# Patient Record
Sex: Female | Born: 1990
Health system: Southern US, Community
[De-identification: ages and names within clinical notes are randomized; demographics above are authoritative.]

## PROBLEM LIST (undated history)

## (undated) DIAGNOSIS — D649 Anemia, unspecified: Secondary | ICD-10-CM

## (undated) DIAGNOSIS — N3010 Interstitial cystitis (chronic) without hematuria: Secondary | ICD-10-CM

## (undated) DIAGNOSIS — K219 Gastro-esophageal reflux disease without esophagitis: Secondary | ICD-10-CM

## (undated) DIAGNOSIS — R3129 Other microscopic hematuria: Secondary | ICD-10-CM

## (undated) DIAGNOSIS — N390 Urinary tract infection, site not specified: Secondary | ICD-10-CM

## (undated) HISTORY — DX: Interstitial cystitis (chronic) without hematuria: N30.10

## (undated) HISTORY — DX: Other microscopic hematuria: R31.29

## (undated) HISTORY — PX: WISDOM TOOTH EXTRACTION: SHX21

---
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
## 2015-10-01 ENCOUNTER — Ambulatory Visit (INDEPENDENT_AMBULATORY_CARE_PROVIDER_SITE_OTHER): Payer: BLUE CROSS/BLUE SHIELD | Admitting: Obstetrics and Gynecology

## 2015-10-01 ENCOUNTER — Encounter: Payer: Self-pay | Admitting: Obstetrics and Gynecology

## 2015-10-01 ENCOUNTER — Other Ambulatory Visit: Payer: Self-pay | Admitting: Obstetrics and Gynecology

## 2015-10-01 VITALS — BP 106/66 | HR 79 | Ht 68.0 in | Wt 133.2 lb

## 2015-10-01 DIAGNOSIS — R300 Dysuria: Secondary | ICD-10-CM

## 2015-10-01 DIAGNOSIS — Z01419 Encounter for gynecological examination (general) (routine) without abnormal findings: Secondary | ICD-10-CM

## 2015-10-01 NOTE — Progress Notes (Signed)
GYNECOLOGY ANNUAL PHYSICAL EXAM PROGRESS NOTE  Subjective:    Monica Price is a 25 y.o. G0P0 female who presents for an annual exam. The patient has the following complaints today: mild intermittent dysuria over 3-4 months. The patient is sexually active. The patient wears seatbelts: yes. The patient participates in regular exercise: no. Has the patient ever been transfused or tattooed?: no. The patient reports that there is not domestic violence in her life.    Gynecologic History Menarche age: 31 Patient's last menstrual period was 09/20/2015. Period Cycle (Days): 28 Period Duration (Days): 5-7 Period Pattern: Regular Menstrual Flow: Moderate Dysmenorrhea: None  Contraception: condoms (inconsistent use), but has been abstinent since July.  History of STI's: Denies  Last Pap: 08/2014. Results were: normal.  Denies h/o abnormal pap smears.    Obstetric History   G0   P0   T0   P0   A0   L0    SAB0   TAB0   Ectopic0   Multiple0   Live Births0       History reviewed. No pertinent past medical history.  History reviewed. No pertinent surgical history.  History reviewed. No pertinent family history.  Social History   Social History  . Marital status: Single    Spouse name: N/A  . Number of children: N/A  . Years of education: N/A   Occupational History  . Not on file.   Social History Main Topics  . Smoking status: Never Smoker  . Smokeless tobacco: Never Used  . Alcohol use No  . Drug use: No  . Sexual activity: No   Other Topics Concern  . Not on file   Social History Narrative  . No narrative on file    No current outpatient prescriptions on file prior to visit.   No current facility-administered medications on file prior to visit.     No Known Allergies   Review of Systems Constitutional: negative for chills, fatigue, fevers and sweats Eyes: negative for irritation, redness and visual disturbance Ears, nose, mouth, throat, and face: negative  for hearing loss, nasal congestion, snoring and tinnitus Respiratory: negative for asthma, cough, sputum Cardiovascular: negative for chest pain, dyspnea, exertional chest pressure/discomfort, irregular heart beat, palpitations and syncope Gastrointestinal: negative for abdominal pain, change in bowel habits, nausea and vomiting Genitourinary: positive for dysuria.  Negative for abnormal menstrual periods, genital lesions, sexual problems and vaginal discharge, and urinary incontinence Integument/breast: negative for breast lump, breast tenderness and nipple discharge Hematologic/lymphatic: negative for bleeding and easy bruising Musculoskeletal:negative for back pain and muscle weakness Neurological: negative for dizziness, headaches, vertigo and weakness Endocrine: negative for diabetic symptoms including polydipsia, polyuria and skin dryness Allergic/Immunologic: negative for hay fever and urticaria     Objective:  Blood pressure 106/66, pulse 79, height 5\' 8"  (1.727 m), weight 133 lb 3.2 oz (60.4 kg), last menstrual period 09/20/2015. Body mass index is 20.25 kg/m.  General Appearance:    Alert, cooperative, no distress, appears stated age  Head:    Normocephalic, without obvious abnormality, atraumatic  Eyes:    PERRL, conjunctiva/corneas clear, EOM's intact, both eyes  Ears:    Normal external ear canals, both ears  Nose:   Nares normal, septum midline, mucosa normal, no drainage or sinus tenderness  Throat:   Lips, mucosa, and tongue normal; teeth and gums normal  Neck:   Supple, symmetrical, trachea midline, no adenopathy; thyroid: no enlargement/tenderness/nodules; no carotid bruit or JVD  Back:     Symmetric,  no curvature, ROM normal, no CVA tenderness  Lungs:     Clear to auscultation bilaterally, respirations unlabored  Chest Wall:    No tenderness or deformity   Heart:    Regular rate and rhythm, S1 and S2 normal, no murmur, rub or gallop  Breast Exam:    No tenderness,  masses, or nipple abnormality  Abdomen:     Soft, non-tender, bowel sounds active all four quadrants, no masses, no organomegaly.    Genitalia:    Pelvic:external genitalia normal, vagina without lesions, discharge, or tenderness, rectovaginal septum  normal. Cervix normal in appearance, no cervical motion tenderness, no adnexal masses or tenderness.  Uterus normal size, shape, mobile, regular contours, nontender.  Rectal:    Normal external sphincter.  No hemorrhoids appreciated. Internal exam not done.   Extremities:   Extremities normal, atraumatic, no cyanosis or edema  Pulses:   2+ and symmetric all extremities  Skin:   Skin color, texture, turgor normal, no rashes or lesions  Lymph nodes:   Cervical, supraclavicular, and axillary nodes normal  Neurologic:   CNII-XII intact, normal strength, sensation and reflexes throughout   .  Labs:  No results found for: WBC, HGB, HCT, MCV, PLT  No results found for: CREATININE, BUN, NA, K, CL, CO2  No results found for: ALT, AST, GGT, ALKPHOS, BILITOT  No results found for: TSH   Assessment:    Healthy female exam.   Dysuria  Plan:     Blood tests: Basic metabolic panel and CBC with diff. Breast self exam technique reviewed and patient encouraged to perform self-exam monthly. Contraception: abstinence.  Also discussed safe sex practices. Discussed healthy lifestyle modifications. Dysuria - mild, intermittent.  Has tried Azo, cranberry juice, probiotics which offered minimal relief.  UA ordered.  GC/Cl performed. Declined STD serology panel.  Discussed Gardasil vaccine, handout given.  Follow up in 1 year.    Rubie Maid, MD [facility]

## 2015-10-01 NOTE — Patient Instructions (Signed)
Health Maintenance, Female Adopting a healthy lifestyle and getting preventive care can go a long way to promote health and wellness. Talk with your health care provider about what schedule of regular examinations is right for you. This is a good chance for you to check in with your provider about disease prevention and staying healthy. In between checkups, there are plenty of things you can do on your own. Experts have done a lot of research about which lifestyle changes and preventive measures are most likely to keep you healthy. Ask your health care provider for more information. WEIGHT AND DIET  Eat a healthy diet  Be sure to include plenty of vegetables, fruits, low-fat dairy products, and lean protein.  Do not eat a lot of foods high in solid fats, added sugars, or salt.  Get regular exercise. This is one of the most important things you can do for your health.  Most adults should exercise for at least 150 minutes each week. The exercise should increase your heart rate and make you sweat (moderate-intensity exercise).  Most adults should also do strengthening exercises at least twice a week. This is in addition to the moderate-intensity exercise.  Maintain a healthy weight  Body mass index (BMI) is a measurement that can be used to identify possible weight problems. It estimates body fat based on height and weight. Your health care provider can help determine your BMI and help you achieve or maintain a healthy weight.  For females 20 years of age and older:   A BMI below 18.5 is considered underweight.  A BMI of 18.5 to 24.9 is normal.  A BMI of 25 to 29.9 is considered overweight.  A BMI of 30 and above is considered obese.  Watch levels of cholesterol and blood lipids  You should start having your blood tested for lipids and cholesterol at 24 years of age, then have this test every 5 years.  You may need to have your cholesterol levels checked more often if:  Your lipid  or cholesterol levels are high.  You are older than 24 years of age.  You are at high risk for heart disease.  CANCER SCREENING   Lung Cancer  Lung cancer screening is recommended for adults 55-80 years old who are at high risk for lung cancer because of a history of smoking.  A yearly low-dose CT scan of the lungs is recommended for people who:  Currently smoke.  Have quit within the past 15 years.  Have at least a 30-pack-year history of smoking. A pack year is smoking an average of one pack of cigarettes a day for 1 year.  Yearly screening should continue until it has been 15 years since you quit.  Yearly screening should stop if you develop a health problem that would prevent you from having lung cancer treatment.  Breast Cancer  Practice breast self-awareness. This means understanding how your breasts normally appear and feel.  It also means doing regular breast self-exams. Let your health care provider know about any changes, no matter how small.  If you are in your 20s or 30s, you should have a clinical breast exam (CBE) by a health care provider every 1-3 years as part of a regular health exam.  If you are 40 or older, have a CBE every year. Also consider having a breast X-ray (mammogram) every year.  If you have a family history of breast cancer, talk to your health care provider about genetic screening.  If you   are at high risk for breast cancer, talk to your health care provider about having an MRI and a mammogram every year.  Breast cancer gene (BRCA) assessment is recommended for women who have family members with BRCA-related cancers. BRCA-related cancers include:  Breast.  Ovarian.  Tubal.  Peritoneal cancers.  Results of the assessment will determine the need for genetic counseling and BRCA1 and BRCA2 testing. Cervical Cancer Your health care provider may recommend that you be screened regularly for cancer of the pelvic organs (ovaries, uterus, and  vagina). This screening involves a pelvic examination, including checking for microscopic changes to the surface of your cervix (Pap test). You may be encouraged to have this screening done every 3 years, beginning at age 21.  For women ages 30-65, health care providers may recommend pelvic exams and Pap testing every 3 years, or they may recommend the Pap and pelvic exam, combined with testing for human papilloma virus (HPV), every 5 years. Some types of HPV increase your risk of cervical cancer. Testing for HPV may also be done on women of any age with unclear Pap test results.  Other health care providers may not recommend any screening for nonpregnant women who are considered low risk for pelvic cancer and who do not have symptoms. Ask your health care provider if a screening pelvic exam is right for you.  If you have had past treatment for cervical cancer or a condition that could lead to cancer, you need Pap tests and screening for cancer for at least 20 years after your treatment. If Pap tests have been discontinued, your risk factors (such as having a new sexual partner) need to be reassessed to determine if screening should resume. Some women have medical problems that increase the chance of getting cervical cancer. In these cases, your health care provider may recommend more frequent screening and Pap tests. Colorectal Cancer  This type of cancer can be detected and often prevented.  Routine colorectal cancer screening usually begins at 24 years of age and continues through 24 years of age.  Your health care provider may recommend screening at an earlier age if you have risk factors for colon cancer.  Your health care provider may also recommend using home test kits to check for hidden blood in the stool.  A small camera at the end of a tube can be used to examine your colon directly (sigmoidoscopy or colonoscopy). This is done to check for the earliest forms of colorectal  cancer.  Routine screening usually begins at age 50.  Direct examination of the colon should be repeated every 5-10 years through 24 years of age. However, you may need to be screened more often if early forms of precancerous polyps or small growths are found. Skin Cancer  Check your skin from head to toe regularly.  Tell your health care provider about any new moles or changes in moles, especially if there is a change in a mole's shape or color.  Also tell your health care provider if you have a mole that is larger than the size of a pencil eraser.  Always use sunscreen. Apply sunscreen liberally and repeatedly throughout the day.  Protect yourself by wearing long sleeves, pants, a wide-brimmed hat, and sunglasses whenever you are outside. HEART DISEASE, DIABETES, AND HIGH BLOOD PRESSURE   High blood pressure causes heart disease and increases the risk of stroke. High blood pressure is more likely to develop in:  People who have blood pressure in the high end   of the normal range (130-139/85-89 mm Hg).  People who are overweight or obese.  People who are African American.  If you are 38-23 years of age, have your blood pressure checked every 3-5 years. If you are 61 years of age or older, have your blood pressure checked every year. You should have your blood pressure measured twice--once when you are at a hospital or clinic, and once when you are not at a hospital or clinic. Record the average of the two measurements. To check your blood pressure when you are not at a hospital or clinic, you can use:  An automated blood pressure machine at a pharmacy.  A home blood pressure monitor.  If you are between 45 years and 39 years old, ask your health care provider if you should take aspirin to prevent strokes.  Have regular diabetes screenings. This involves taking a blood sample to check your fasting blood sugar level.  If you are at a normal weight and have a low risk for diabetes,  have this test once every three years after 24 years of age.  If you are overweight and have a high risk for diabetes, consider being tested at a younger age or more often. PREVENTING INFECTION  Hepatitis B  If you have a higher risk for hepatitis B, you should be screened for this virus. You are considered at high risk for hepatitis B if:  You were born in a country where hepatitis B is common. Ask your health care provider which countries are considered high risk.  Your parents were born in a high-risk country, and you have not been immunized against hepatitis B (hepatitis B vaccine).  You have HIV or AIDS.  You use needles to inject street drugs.  You live with someone who has hepatitis B.  You have had sex with someone who has hepatitis B.  You get hemodialysis treatment.  You take certain medicines for conditions, including cancer, organ transplantation, and autoimmune conditions. Hepatitis C  Blood testing is recommended for:  Everyone born from [birth year] through 1964.  Anyone with known risk factors for hepatitis C. Sexually transmitted infections (STIs)  You should be screened for sexually transmitted infections (STIs) including gonorrhea and chlamydia if:  You are sexually active and are younger than 24 years of age.  You are older than 24 years of age and your health care provider tells you that you are at risk for this type of infection.  Your sexual activity has changed since you were last screened and you are at an increased risk for chlamydia or gonorrhea. Ask your health care provider if you are at risk.  If you do not have HIV, but are at risk, it may be recommended that you take a prescription medicine daily to prevent HIV infection. This is called pre-exposure prophylaxis (PrEP). You are considered at risk if:  You are sexually active and do not regularly use condoms or know the HIV status of your partner(s).  You take drugs by injection.  You are sexually  active with a partner who has HIV. Talk with your health care provider about whether you are at high risk of being infected with HIV. If you choose to begin PrEP, you should first be tested for HIV. You should then be tested every 3 months for as long as you are taking PrEP.  PREGNANCY   If you are premenopausal and you may become pregnant, ask your health care provider about preconception counseling.  If you may  become pregnant, take 400 to 800 micrograms (mcg) of folic acid every day.  If you want to prevent pregnancy, talk to your health care provider about birth control (contraception). OSTEOPOROSIS AND MENOPAUSE   Osteoporosis is a disease in which the bones lose minerals and strength with aging. This can result in serious bone fractures. Your risk for osteoporosis can be identified using a bone density scan.  If you are 61 years of age or older, or if you are at risk for osteoporosis and fractures, ask your health care provider if you should be screened.  Ask your health care provider whether you should take a calcium or vitamin D supplement to lower your risk for osteoporosis.  Menopause may have certain physical symptoms and risks.  Hormone replacement therapy may reduce some of these symptoms and risks. Talk to your health care provider about whether hormone replacement therapy is right for you.  HOME CARE INSTRUCTIONS   Schedule regular health, dental, and eye exams.  Stay current with your immunizations.   Do not use any tobacco products including cigarettes, chewing tobacco, or electronic cigarettes.  If you are pregnant, do not drink alcohol.  If you are breastfeeding, limit how much and how often you drink alcohol.  Limit alcohol intake to no more than 1 drink per day for nonpregnant women. One drink equals 12 ounces of beer, 5 ounces of wine, or 1 ounces of hard liquor.  Do not use street drugs.  Do not share needles.  Ask your health care provider for help if  you need support or information about quitting drugs.  Tell your health care provider if you often feel depressed.  Tell your health care provider if you have ever been abused or do not feel safe at home.   This information is not intended to replace advice given to you by your health care provider. Make sure you discuss any questions you have with your health care provider.   Document Released: 07/14/2010 Document Revised: 01/19/2014 Document Reviewed: 11/30/2012 Elsevier Interactive Patient Education [employer].

## 2015-10-02 LAB — CBC
HEMATOCRIT: 39 % (ref 34.0–46.6)
Hemoglobin: 13.5 g/dL (ref 11.1–15.9)
MCH: 31.2 pg (ref 26.6–33.0)
MCHC: 34.6 g/dL (ref 31.5–35.7)
MCV: 90 fL (ref 79–97)
PLATELETS: 189 10*3/uL (ref 150–379)
RBC: 4.33 x10E6/uL (ref 3.77–5.28)
RDW: 13.4 % (ref 12.3–15.4)
WBC: 7.2 10*3/uL (ref 3.4–10.8)

## 2015-10-02 LAB — URINALYSIS, ROUTINE W REFLEX MICROSCOPIC
Bilirubin, UA: NEGATIVE
Glucose, UA: NEGATIVE
Ketones, UA: NEGATIVE
Nitrite, UA: NEGATIVE
PH UA: 6.5 (ref 5.0–7.5)
PROTEIN UA: NEGATIVE
Specific Gravity, UA: 1.021 (ref 1.005–1.030)
Urobilinogen, Ur: 0.2 mg/dL (ref 0.2–1.0)

## 2015-10-02 LAB — BASIC METABOLIC PANEL
BUN/Creatinine Ratio: 18 (ref 9–23)
BUN: 13 mg/dL (ref 6–20)
CO2: 24 mmol/L (ref 18–29)
Calcium: 9.3 mg/dL (ref 8.7–10.2)
Chloride: 98 mmol/L (ref 96–106)
Creatinine, Ser: 0.74 mg/dL (ref 0.57–1.00)
GFR, EST AFRICAN AMERICAN: 131 mL/min/{1.73_m2} (ref >59)
GFR, EST NON AFRICAN AMERICAN: 114 mL/min/{1.73_m2} (ref >59)
Glucose: 78 mg/dL (ref 65–99)
POTASSIUM: 3.9 mmol/L (ref 3.5–5.2)
SODIUM: 138 mmol/L (ref 134–144)

## 2015-10-02 LAB — MICROSCOPIC EXAMINATION: CASTS: NONE SEEN /LPF

## 2015-10-03 LAB — URINE CULTURE: ORGANISM ID, BACTERIA: NO GROWTH

## 2015-10-08 ENCOUNTER — Telehealth: Payer: Self-pay

## 2015-10-08 DIAGNOSIS — R319 Hematuria, unspecified: Secondary | ICD-10-CM

## 2015-10-08 NOTE — Telephone Encounter (Signed)
-----   Message from Rubie Maid, MD sent at 10/03/2015 10:45 AM EDT ----- Patient with no evidence of UTI, however there was noted to be some microscopic blood in her urine.  Would recommend repeating in 3-4 weeks.  If still present, she may benefit from a urology referral to r/o stones or bladder cyst as cause of her urinary symptoms.

## 2015-10-08 NOTE — Telephone Encounter (Signed)
Called pt no answer, LM for pt informing her of information below.  

## 2015-11-06 ENCOUNTER — Telehealth: Payer: Self-pay | Admitting: Obstetrics and Gynecology

## 2015-11-06 NOTE — Telephone Encounter (Signed)
error 

## 2015-11-07 ENCOUNTER — Other Ambulatory Visit: Payer: BLUE CROSS/BLUE SHIELD

## 2015-11-07 DIAGNOSIS — R319 Hematuria, unspecified: Secondary | ICD-10-CM

## 2015-11-08 LAB — URINALYSIS, ROUTINE W REFLEX MICROSCOPIC
BILIRUBIN UA: NEGATIVE
GLUCOSE, UA: NEGATIVE
KETONES UA: NEGATIVE
Nitrite, UA: NEGATIVE
PH UA: 6.5 (ref 5.0–7.5)
PROTEIN UA: NEGATIVE
SPEC GRAV UA: 1.009 (ref 1.005–1.030)
UUROB: 0.2 mg/dL (ref 0.2–1.0)

## 2015-11-08 LAB — MICROSCOPIC EXAMINATION: Epithelial Cells (non renal): >10 /hpf — AB (ref 0–10)

## 2015-11-14 ENCOUNTER — Telehealth: Payer: Self-pay | Admitting: Obstetrics and Gynecology

## 2015-11-14 DIAGNOSIS — R3121 Asymptomatic microscopic hematuria: Secondary | ICD-10-CM

## 2015-11-14 NOTE — Telephone Encounter (Signed)
Patient called requesting lab results from 10/26. Thanks

## 2015-11-14 NOTE — Telephone Encounter (Signed)
Called pt informed her of hematuria and the need for referral to urology.Referral placed, pt gave verbal understanding.

## 2015-11-21 ENCOUNTER — Encounter: Payer: Self-pay | Admitting: Urology

## 2015-11-21 ENCOUNTER — Ambulatory Visit (INDEPENDENT_AMBULATORY_CARE_PROVIDER_SITE_OTHER): Payer: BLUE CROSS/BLUE SHIELD | Admitting: Urology

## 2015-11-21 VITALS — BP 109/69 | HR 68 | Ht 68.0 in | Wt 134.2 lb

## 2015-11-21 DIAGNOSIS — R399 Unspecified symptoms and signs involving the genitourinary system: Secondary | ICD-10-CM | POA: Diagnosis not present

## 2015-11-21 DIAGNOSIS — R102 Pelvic and perineal pain: Secondary | ICD-10-CM | POA: Diagnosis not present

## 2015-11-21 DIAGNOSIS — R3129 Other microscopic hematuria: Secondary | ICD-10-CM | POA: Diagnosis not present

## 2015-11-21 LAB — URINALYSIS, COMPLETE
BILIRUBIN UA: NEGATIVE
Glucose, UA: NEGATIVE
KETONES UA: NEGATIVE
LEUKOCYTES UA: NEGATIVE
Nitrite, UA: NEGATIVE
PROTEIN UA: NEGATIVE
SPEC GRAV UA: 1.02 (ref 1.005–1.030)
UUROB: 0.2 mg/dL (ref 0.2–1.0)
pH, UA: 6 (ref 5.0–7.5)

## 2015-11-21 LAB — BLADDER SCAN AMB NON-IMAGING: SCAN RESULT: 30

## 2015-11-21 LAB — MICROSCOPIC EXAMINATION

## 2015-11-21 NOTE — Progress Notes (Signed)
11/21/2015 9:05 AM   Monica Price October 19, 1990 [id number]  Referring provider: No referring provider defined for this encounter.  Chief Complaint  Patient presents with  . New Patient (Initial Visit)    Microscopic hematuria referred by Dr. Marcelline Mates    HPI: Patient is a 25 -year-old Caucasian female who presents today as a referral from their PCP, Dr. Marcelline Mates, for microscopic hematuria.    Patient was found to have microscopic hematuria on 11/07/2015 with 11-30 RBC's/hpf and 10/01/2015 with 3-10 RBC's/hpf.  Urine culture was negative on the 10/01/2015.  Patient doesn't have a prior history of microscopic hematuria.    She does not have a prior history of recurrent urinary tract infections, nephrolithiasis, trauma to the genitourinary tract or malignancies of the genitourinary tract.   Her maternal grandmother has a history of stones and two great aunts with bladder cancer.     Today, she complains of frequent urination, dysuria, nocturia, incontinence, intermittency, straining to urinate and a weak urinary stream.  She also experiencing pain with sex, but she has not been sexually active since July 2017.  Her UA today demonstrates 3-10 RBC's/hpf.  These symptoms have been occurring since June 2017.  She had been treated for a yeast infection and an UTI, but her symptoms did not improve.    She is experiencing any suprapubic pain, but she denies abdominal pain or flank pain.  She denies any recent fevers, chills, nausea or vomiting.   She has not had any recent imaging studies.   She is not a smoker.  She is not exposed to secondhand smoke.  She does not work with [employer].   PMH: Past Medical History:  Diagnosis Date  . Microscopic hematuria     Surgical History: Past Surgical History:  Procedure Laterality Date  . WISDOM TOOTH EXTRACTION      Home Medications:    Medication List    as of 11/21/2015  9:05 AM   You have not been prescribed any  medications.     Allergies: No Known Allergies  Family History: Family History  Problem Relation Age of Onset  . Bladder Cancer Maternal Aunt   . Bladder Cancer Maternal Uncle   . Kidney disease Neg Hx   . Prostate cancer Neg Hx     Social History:  reports that she has never smoked. She has never used smokeless tobacco. She reports that she does not drink alcohol or use drugs.  ROS: UROLOGY Frequent Urination?: Yes Hard to postpone urination?: No Burning/pain with urination?: Yes Get up at night to urinate?: Yes Leakage of urine?: No Urine stream starts and stops?: Yes Trouble starting stream?: No Do you have to strain to urinate?: Yes Blood in urine?: Yes Urinary tract infection?: No Sexually transmitted disease?: No Injury to kidneys or bladder?: No Painful intercourse?: No Weak stream?: Yes Currently pregnant?: No Vaginal bleeding?: No Last menstrual period?: n  Gastrointestinal Nausea?: No Vomiting?: No Indigestion/heartburn?: No Diarrhea?: No Constipation?: No  Constitutional Fever: No Night sweats?: No Weight loss?: No Fatigue?: No  Skin Skin rash/lesions?: No Itching?: No  Eyes Blurred vision?: No Double vision?: No  Ears/Nose/Throat Sore throat?: No Sinus problems?: No  Hematologic/Lymphatic Swollen glands?: No Easy bruising?: No  Cardiovascular Leg swelling?: No Chest pain?: No  Respiratory Cough?: No Shortness of breath?: No  Endocrine Excessive thirst?: No  Musculoskeletal Back pain?: No Joint pain?: No  Neurological Headaches?: No Dizziness?: No  Psychologic Depression?: No Anxiety?: No  Physical Exam: BP  109/69   Pulse 68   Ht 5\' 8"  (1.727 m)   Wt 134 lb 3.2 oz (60.9 kg)   LMP 11/11/2015   BMI 20.41 kg/m   Constitutional: Well nourished. Alert and oriented, No acute distress. HEENT: [place] AT, moist mucus membranes. Trachea midline, no masses. Cardiovascular: No clubbing, cyanosis, or edema. Respiratory:  Normal respiratory effort, no increased work of breathing. GI: Abdomen is soft, non tender, non distended, no abdominal masses. Liver and spleen not palpable.  No hernias appreciated.  Stool sample for occult testing is not indicated.   GU: No CVA tenderness.  No bladder fullness or masses.   Skin: No rashes, bruises or suspicious lesions. Lymph: No cervical or inguinal adenopathy. Neurologic: Grossly intact, no focal deficits, moving all 4 extremities. Psychiatric: Normal mood and affect.  Laboratory Data: Lab Results  Component Value Date   WBC 7.2 10/01/2015   HCT 39.0 10/01/2015   MCV 90 10/01/2015   PLT 189 10/01/2015    Lab Results  Component Value Date   CREATININE 0.74 10/01/2015    Urinalysis 3-10 RBC's/hpf.  See EPIC.    Pertinent Imaging   Assessment & Plan:    1. Microscopic hematuria  -  I explained to the patient that there are a number of causes that can be associated with blood in the urine, such as stones, UTI's, damage to the urinary tract and/or cancer.  - At this time, I felt that the patient warranted further urologic evaluation.   The AUA guidelines state that a CT urogram is the preferred imaging study to evaluate hematuria.  - I explained to the patient that a contrast material will be injected into a vein and that in rare instances, an allergic reaction can result and may even life threatening   The patient denies any allergies to contrast, iodine and/or seafood and is not taking metformin.  - Her reproductive status is unknown at this time. We will obtain a serum pregnancy test today.   - The patient had the opportunity to ask questions which were answered. Based upon this discussion, the patient is willing to proceed. Therefore, I've ordered: a CT Urogram   - She will return following all of the above for discussion of the results.     - Urinalysis, Complete  - CULTURE, URINE COMPREHENSIVE  - BUN+Creat  - hCG, serum, qualitative  2. Suprapubic  pain  - see above  3. LUTS  - if no etiology for her urinary symptoms are found on CT Urogram, we will reassess  Return for CT Urogram report.  These notes generated with voice recognition software. I apologize for typographical errors.  Zara Council, [facility] [address], [place], [place] [address] [phone]

## 2015-11-22 LAB — HCG, SERUM, QUALITATIVE: HCG, BETA SUBUNIT, QUAL, SERUM: NEGATIVE m[IU]/mL (ref <6)

## 2015-11-22 LAB — BUN+CREAT
BUN / CREAT RATIO: 14 (ref 9–23)
BUN: 11 mg/dL (ref 6–20)
CREATININE: 0.78 mg/dL (ref 0.57–1.00)
GFR calc Af Amer: 123 mL/min/{1.73_m2} (ref >59)
GFR, EST NON AFRICAN AMERICAN: 107 mL/min/{1.73_m2} (ref >59)

## 2015-11-24 LAB — CULTURE, URINE COMPREHENSIVE

## 2015-11-25 ENCOUNTER — Ambulatory Visit: Payer: Self-pay | Admitting: Urology

## 2015-11-28 ENCOUNTER — Other Ambulatory Visit: Payer: Self-pay | Admitting: Urology

## 2015-11-28 ENCOUNTER — Telehealth: Payer: Self-pay | Admitting: Urology

## 2015-11-28 DIAGNOSIS — R3129 Other microscopic hematuria: Secondary | ICD-10-CM

## 2015-11-28 NOTE — Telephone Encounter (Signed)
Please place the order for the CT urogram for this patient per your office note.

## 2015-11-28 NOTE — Telephone Encounter (Signed)
Done. Monica Price

## 2015-12-09 ENCOUNTER — Ambulatory Visit
Admission: RE | Admit: 2015-12-09 | Discharge: 2015-12-09 | Disposition: A | Discharge status: Home / Self Care | Payer: BLUE CROSS/BLUE SHIELD | Source: Ambulatory Visit | Attending: Urology | Admitting: Urology

## 2015-12-09 DIAGNOSIS — R3129 Other microscopic hematuria: Secondary | ICD-10-CM | POA: Diagnosis present

## 2015-12-09 DIAGNOSIS — N281 Cyst of kidney, acquired: Secondary | ICD-10-CM | POA: Diagnosis not present

## 2015-12-09 MED ORDER — IOPAMIDOL (ISOVUE-300) INJECTION 61%
150.0000 mL | Freq: Once | INTRAVENOUS | Status: AC | PRN
  Administered 2015-12-09: 150 mL via INTRAVENOUS

## 2015-12-10 ENCOUNTER — Ambulatory Visit: Payer: Self-pay | Admitting: Urology

## 2015-12-12 ENCOUNTER — Ambulatory Visit (INDEPENDENT_AMBULATORY_CARE_PROVIDER_SITE_OTHER): Payer: BLUE CROSS/BLUE SHIELD | Admitting: Urology

## 2015-12-12 ENCOUNTER — Encounter: Payer: Self-pay | Admitting: Urology

## 2015-12-12 VITALS — Ht 68.0 in | Wt 133.2 lb

## 2015-12-12 DIAGNOSIS — R102 Pelvic and perineal pain: Secondary | ICD-10-CM

## 2015-12-12 DIAGNOSIS — R3129 Other microscopic hematuria: Secondary | ICD-10-CM

## 2015-12-12 DIAGNOSIS — R399 Unspecified symptoms and signs involving the genitourinary system: Secondary | ICD-10-CM | POA: Diagnosis not present

## 2015-12-12 LAB — URINALYSIS, COMPLETE
Bilirubin, UA: NEGATIVE
Glucose, UA: NEGATIVE
Ketones, UA: NEGATIVE
Nitrite, UA: NEGATIVE
PH UA: 6 (ref 5.0–7.5)
Specific Gravity, UA: 1.025 (ref 1.005–1.030)
UUROB: 0.2 mg/dL (ref 0.2–1.0)

## 2015-12-12 LAB — MICROSCOPIC EXAMINATION

## 2015-12-12 MED ORDER — NITROFURANTOIN MONOHYD MACRO 100 MG PO CAPS: 100.0000 mg | ORAL_CAPSULE | Freq: Two times a day (BID) | ORAL | 0 refills | Status: DC

## 2015-12-12 NOTE — Addendum Note (Signed)
Addended by: Zara Council A on: 12/12/2015 12:20 PM   Modules accepted: Orders

## 2015-12-12 NOTE — Progress Notes (Addendum)
12/12/2015 10:53 AM   Monica Price 13-Sep-1990 [id number]  Referring provider: Rubie Maid, MD [place], [place] [address]  Chief Complaint  Patient presents with  . Results    CT     HPI: Patient is a 25 year old Caucasian female who presents today for further discussion regarding her CT Urogram.    Background history Patient was a referral from their PCP, Dr. Marcelline Mates, for microscopic hematuria.  Patient was found to have microscopic hematuria on 11/07/2015 with 11-30 RBC's/hpf and 10/01/2015 with 3-10 RBC's/hpf.  Urine culture was negative on the 10/01/2015.  Patient doesn't have a prior history of microscopic hematuria.  She does not have a prior history of recurrent urinary tract infections, nephrolithiasis, trauma to the genitourinary tract or malignancies of the genitourinary tract.  Her maternal grandmother has a history of stones and two great aunts with bladder cancer.   She complained of frequent urination, dysuria, nocturia, incontinence, intermittency, straining to urinate and a weak urinary stream.  She also experiencing pain with sex, but she has not been sexually active since July 2017.  Her UA  demonstrated 3-10 RBC's/hpf.  These symptoms have been occurring since June 2017.  She had been treated for a yeast infection and an UTI, but her symptoms did not improve.  She is experiencing suprapubic pain, but she denies abdominal pain or flank pain.  She denies any recent fevers, chills, nausea or vomiting.   She has not had any recent imaging studies.   She is not a smoker.  She is not exposed to secondhand smoke.  She does not work with [employer].   Her CT Urogram performed on 12/09/2015 noted no nephroureterolithiasis. No hydronephrosis. No suspicious filling defects identified within the opacified portions of the renal collecting systems and ureters.  Right renal cyst.  Bilateral pars defects, L5.  I have independently reviewed.    Today,  she is still experiencing dysuria, intermittency, urgency and a weak urinary stream.  She is not experiencing fevers, chills, nausea or vomiting.  Her UA demonstrates   PMH: Past Medical History:  Diagnosis Date  . Microscopic hematuria     Surgical History: Past Surgical History:  Procedure Laterality Date  . WISDOM TOOTH EXTRACTION      Home Medications:    Medication List       Accurate as of 12/12/15 10:53 AM. Always use your most recent med list.          nitrofurantoin (macrocrystal-monohydrate) 100 MG capsule Commonly known as:  MACROBID Take 1 capsule (100 mg total) by mouth every 12 (twelve) hours.       Allergies: No Known Allergies  Family History: Family History  Problem Relation Age of Onset  . Bladder Cancer Maternal Aunt   . Bladder Cancer Maternal Uncle   . Kidney disease Neg Hx   . Prostate cancer Neg Hx     Social History:  reports that she has never smoked. She has never used smokeless tobacco. She reports that she does not drink alcohol or use drugs.  ROS: UROLOGY Frequent Urination?: No Hard to postpone urination?: No Burning/pain with urination?: Yes Get up at night to urinate?: No Leakage of urine?: No Urine stream starts and stops?: Yes Trouble starting stream?: No Do you have to strain to urinate?: Yes Blood in urine?: Yes Urinary tract infection?: No Sexually transmitted disease?: No Injury to kidneys or bladder?: No Painful intercourse?: No Weak stream?: Yes Currently pregnant?:  No Vaginal bleeding?: No Last menstrual period?: n  Gastrointestinal Nausea?: No Vomiting?: No Indigestion/heartburn?: No Diarrhea?: No Constipation?: No  Constitutional Fever: No Night sweats?: No Weight loss?: No Fatigue?: No  Skin Skin rash/lesions?: No Itching?: No  Eyes Blurred vision?: No Double vision?: No  Ears/Nose/Throat Sore throat?: No Sinus problems?: No  Hematologic/Lymphatic Swollen glands?: No Easy bruising?:  No  Cardiovascular Leg swelling?: No Chest pain?: No  Respiratory Cough?: No Shortness of breath?: No  Endocrine Excessive thirst?: No  Musculoskeletal Back pain?: No Joint pain?: No  Neurological Headaches?: No Dizziness?: No  Psychologic Depression?: No Anxiety?: No  Physical Exam: Ht 5\' 8"  (1.727 m)   Wt 133 lb 3.2 oz (60.4 kg)   LMP 11/28/2015   BMI 20.25 kg/m   Constitutional: Well nourished. Alert and oriented, No acute distress. HEENT: [place] AT, moist mucus membranes. Trachea midline, no masses. Cardiovascular: No clubbing, cyanosis, or edema. Respiratory: Normal respiratory effort, no increased work of breathing. GI: Abdomen is soft, non tender, non distended, no abdominal masses. Liver and spleen not palpable.  No hernias appreciated.  Stool sample for occult testing is not indicated.   GU: No CVA tenderness.  No bladder fullness or masses.   Skin: No rashes, bruises or suspicious lesions. Lymph: No cervical or inguinal adenopathy. Neurologic: Grossly intact, no focal deficits, moving all 4 extremities. Psychiatric: Normal mood and affect.  Laboratory Data: Lab Results  Component Value Date   WBC 7.2 10/01/2015   HCT 39.0 10/01/2015   MCV 90 10/01/2015   PLT 189 10/01/2015    Lab Results  Component Value Date   CREATININE 0.78 11/21/2015    Urinalysis 3-10 RBC's/hpf and 11-30 WBC's/hpf.   See EPIC.    Pertinent Imaging CLINICAL DATA:  Patient with history of microscopic hematuria.  EXAM: CT ABDOMEN AND PELVIS WITHOUT AND WITH CONTRAST  TECHNIQUE: Multidetector CT imaging of the abdomen and pelvis was performed following the standard protocol before and following the bolus administration of intravenous contrast.  CONTRAST:  130mL ISOVUE-300 IOPAMIDOL (ISOVUE-300) INJECTION 61%  COMPARISON:  None.  FINDINGS: Lower chest: Normal heart size. No consolidative or nodular pulmonary opacities. No pleural effusion.  Hepatobiliary:  Liver is normal in size and contour. No focal hepatic lesion is identified. Gallbladder is unremarkable.  Pancreas: Unremarkable  Spleen: Unremarkable  Adrenals/Urinary Tract: The adrenal glands are normal. Kidneys enhance symmetrically with contrast. Within the interpolar region of the right kidney there is a 1.5 cm nonenhancing lesion most compatible with a cyst. Noncontrast images demonstrate no nephroureterolithiasis. No hydronephrosis. Delayed images demonstrate excretion of contrast material into the bilateral renal collecting systems, ureters and bladder. The opacified portions are unremarkable. The mid and distal right ureter is not opacified and therefore not well assessed.  Stomach/Bowel: No abnormal bowel wall thickening or evidence for bowel obstruction. No free fluid or free intraperitoneal air.  Vascular/Lymphatic: Normal caliber abdominal aorta. No retroperitoneal lymphadenopathy.  Reproductive: Uterus and adnexal structures are unremarkable.  Other: None.  Musculoskeletal: No aggressive or acute appearing osseous lesions. Bilateral L5 pars defects.  IMPRESSION: No nephroureterolithiasis. No hydronephrosis. No suspicious filling defects identified within the opacified portions of the renal collecting systems and ureters.  Right renal cyst.  Bilateral pars defects, L5.   Electronically Signed   By: Lovey Newcomer M.D.   On: 12/10/2015 09:14   Assessment & Plan:    1. Microscopic hematuria  - CT Urogram did not identify any etiology for her hematuria  - Urinalysis, Complete  -  CULTURE, URINE COMPREHENSIVE   2. Suprapubic pain  -  CT Urogram did not identify any etiology for her suprapubic pain  -  Dicussed undergoing cystoscopy vs 3 month of Macrobid  - patient will give a trial of three months of Macrobid and RTC in 3 months for symptom recheck    3. LUTS  - no etiology for her urinary symptoms are found on CT Urogram  - see  above  Return in about 3 months (around 03/11/2016) for UA and symptom recheck.  These notes generated with voice recognition software. I apologize for typographical errors.  Zara Council, [facility] [address], [place], [place] [address] [phone]

## 2015-12-15 LAB — CULTURE, URINE COMPREHENSIVE

## 2015-12-16 ENCOUNTER — Telehealth: Payer: Self-pay

## 2015-12-16 DIAGNOSIS — N390 Urinary tract infection, site not specified: Secondary | ICD-10-CM

## 2015-12-16 MED ORDER — AMOXICILLIN-POT CLAVULANATE 875-125 MG PO TABS: 1.0000 | ORAL_TABLET | Freq: Two times a day (BID) | ORAL | 0 refills | Status: AC

## 2015-12-16 NOTE — Telephone Encounter (Signed)
-----   Message from Nori Riis, PA-C sent at 12/15/2015  7:30 PM EST ----- Patient has a +UCx.  She needs to hold the Macrobid and start Augmentin 875/125 one tablet twice daily for seven days then continue with the Macrobid.   They also need to take a probiotic with the antibiotic course.  The dosage is listed below:  L. acidophilus and L. casei (25 x 109 CFU/day for 2 days, then 50 x 109 CFU/day for duration of the antibiotic course)

## 2015-12-16 NOTE — Telephone Encounter (Signed)
LMOM-medication sent to pharmacy 

## 2015-12-17 ENCOUNTER — Ambulatory Visit: Payer: Self-pay | Admitting: Urology

## 2015-12-17 NOTE — Telephone Encounter (Signed)
LMOM

## 2015-12-19 NOTE — Telephone Encounter (Signed)
LMOM- will send a letter.  

## 2016-01-17 ENCOUNTER — Telehealth: Payer: Self-pay

## 2016-01-17 NOTE — Telephone Encounter (Signed)
Pt called stating she has been taking the macrobid but has developed nausea. Pt denied taking medication with a meal, yogurt, or probiotic. Reinforced with pt the need of a probiotic or yogurt. Pt voiced understanding.

## 2016-03-10 NOTE — Progress Notes (Signed)
03/11/2016 9:32 AM   Monica Price 06-27-1990 [id number]  Referring provider: Rubie Maid, MD [place], [place] [address]  Chief Complaint  Patient presents with  . Hematuria    micro 3 month follow up  . Abdominal Pain    3 month follow up    HPI: Patient is a 26 yo WF who presents today for a recheck after a trial of nitrofurantoin for suprapubic discomfort.  Background history Patient was a referral from their PCP, Dr. Marcelline Mates, for microscopic hematuria.  Patient was found to have microscopic hematuria on 11/07/2015 with 11-30 RBC's/hpf and 10/01/2015 with 3-10 RBC's/hpf.  Urine culture was negative on the 10/01/2015.  Patient doesn't have a prior history of microscopic hematuria.  She does not have a prior history of recurrent urinary tract infections, nephrolithiasis, trauma to the genitourinary tract or malignancies of the genitourinary tract.  Her maternal grandmother has a history of stones and two great aunts with bladder cancer.   She complained of frequent urination, dysuria, nocturia, incontinence, intermittency, straining to urinate and a weak urinary stream.  She also experiencing pain with sex, but she has not been sexually active since July 2017.  Her UA  demonstrated 3-10 RBC's/hpf.  These symptoms have been occurring since June 2017.  She had been treated for a yeast infection and an UTI, but her symptoms did not improve.  She is experiencing suprapubic pain, but she denies abdominal pain or flank pain.  She denies any recent fevers, chills, nausea or vomiting.   She has not had any recent imaging studies.   She is not a smoker.  She is not exposed to secondhand smoke.  She does not work with [employer].  Her CT Urogram performed on 12/09/2015 noted no nephroureterolithiasis. No hydronephrosis. No suspicious filling defects identified within the opacified portions of the renal collecting systems and ureters.  Right renal cyst.  Bilateral  pars defects, L5.    Today, she is complains of urgnecy x 3, frequency x 7, incontinence x 3 and nocturia x 3.  still experiencing dysuria, intermittency, urgency and a weak urinary stream.  She is still experiencing a slight twinge when she completes urination.  She is not experiencing fevers, chills, nausea or vomiting.  She drinks 32 ounces of water daily, but she is drinking [employer] every other day.  Her UA demonstrates no microscopic hematuria.     PMH: Past Medical History:  Diagnosis Date  . Microscopic hematuria     Surgical History: Past Surgical History:  Procedure Laterality Date  . WISDOM TOOTH EXTRACTION      Home Medications:  Allergies as of 03/11/2016   No Known Allergies     Medication List       Accurate as of 03/11/16  9:32 AM. Always use your most recent med list.          nitrofurantoin (macrocrystal-monohydrate) 100 MG capsule Commonly known as:  MACROBID Take 1 capsule (100 mg total) by mouth every 12 (twelve) hours.       Allergies: No Known Allergies  Family History: Family History  Problem Relation Age of Onset  . Bladder Cancer Maternal Aunt   . Bladder Cancer Maternal Uncle   . Kidney disease Neg Hx   . Prostate cancer Neg Hx     Social History:  reports that she has never smoked. She has never used smokeless tobacco. She reports that she does not drink alcohol or use drugs.  ROS: UROLOGY Frequent Urination?: No Hard to postpone urination?: No Burning/pain with urination?: No Get up at night to urinate?: No Leakage of urine?: No Urine stream starts and stops?: No Trouble starting stream?: No Do you have to strain to urinate?: No Blood in urine?: No Urinary tract infection?: No Sexually transmitted disease?: No Injury to kidneys or bladder?: No Painful intercourse?: No Weak stream?: No Currently pregnant?: No Vaginal bleeding?: No Last menstrual period?: n  Gastrointestinal Nausea?: Yes Vomiting?:  No Indigestion/heartburn?: No Diarrhea?: No Constipation?: No  Constitutional Fever: No Night sweats?: No Weight loss?: No Fatigue?: No  Skin Skin rash/lesions?: No Itching?: No  Eyes Blurred vision?: No Double vision?: No  Ears/Nose/Throat Sore throat?: No Sinus problems?: No  Hematologic/Lymphatic Swollen glands?: No Easy bruising?: No  Cardiovascular Leg swelling?: No Chest pain?: No  Respiratory Cough?: No Shortness of breath?: No  Endocrine Excessive thirst?: No  Musculoskeletal Back pain?: No Joint pain?: No  Neurological Headaches?: No Dizziness?: No  Psychologic Depression?: No Anxiety?: No  Physical Exam: BP 108/73   Pulse 67   Ht 5\' 8"  (1.727 m)   Wt 138 lb 3.2 oz (62.7 kg)   LMP 02/23/2016   BMI 21.01 kg/m   Constitutional: Well nourished. Alert and oriented, No acute distress. HEENT: [place] AT, moist mucus membranes. Trachea midline, no masses. Cardiovascular: No clubbing, cyanosis, or edema. Respiratory: Normal respiratory effort, no increased work of breathing. GI: Abdomen is soft, non tender, non distended, no abdominal masses. Liver and spleen not palpable.  No hernias appreciated.  Stool sample for occult testing is not indicated.   GU: No CVA tenderness.  No bladder fullness or masses.   Skin: No rashes, bruises or suspicious lesions. Lymph: No cervical or inguinal adenopathy. Neurologic: Grossly intact, no focal deficits, moving all 4 extremities. Psychiatric: Normal mood and affect.  Laboratory Data: Lab Results  Component Value Date   WBC 7.2 10/01/2015   HCT 39.0 10/01/2015   MCV 90 10/01/2015   PLT 189 10/01/2015    Lab Results  Component Value Date   CREATININE 0.78 11/21/2015    Urinalysis Unremarkable.  See EPIC.    Pertinent Imaging CLINICAL DATA:  Patient with history of microscopic hematuria.  EXAM: CT ABDOMEN AND PELVIS WITHOUT AND WITH CONTRAST  TECHNIQUE: Multidetector CT imaging of the  abdomen and pelvis was performed following the standard protocol before and following the bolus administration of intravenous contrast.  CONTRAST:  140mL ISOVUE-300 IOPAMIDOL (ISOVUE-300) INJECTION 61%  COMPARISON:  None.  FINDINGS: Lower chest: Normal heart size. No consolidative or nodular pulmonary opacities. No pleural effusion.  Hepatobiliary: Liver is normal in size and contour. No focal hepatic lesion is identified. Gallbladder is unremarkable.  Pancreas: Unremarkable  Spleen: Unremarkable  Adrenals/Urinary Tract: The adrenal glands are normal. Kidneys enhance symmetrically with contrast. Within the interpolar region of the right kidney there is a 1.5 cm nonenhancing lesion most compatible with a cyst. Noncontrast images demonstrate no nephroureterolithiasis. No hydronephrosis. Delayed images demonstrate excretion of contrast material into the bilateral renal collecting systems, ureters and bladder. The opacified portions are unremarkable. The mid and distal right ureter is not opacified and therefore not well assessed.  Stomach/Bowel: No abnormal bowel wall thickening or evidence for bowel obstruction. No free fluid or free intraperitoneal air.  Vascular/Lymphatic: Normal caliber abdominal aorta. No retroperitoneal lymphadenopathy.  Reproductive: Uterus and adnexal structures are unremarkable.  Other: None.  Musculoskeletal: No aggressive or acute appearing osseous lesions. Bilateral L5 pars defects.  IMPRESSION: No nephroureterolithiasis.  No hydronephrosis. No suspicious filling defects identified within the opacified portions of the renal collecting systems and ureters.  Right renal cyst.  Bilateral pars defects, L5.   Electronically Signed   By: Lovey Newcomer M.D.   On: 12/10/2015 09:14   Assessment & Plan:    1. Microscopic hematuria  - resolved  - CT Urogram did not identify any etiology for her hematuria  - Urinalysis,  Complete  - CULTURE, URINE COMPREHENSIVE   2. Suprapubic pain  -  CT Urogram did not identify any etiology for her suprapubic pain  -  Still experiencing irritation when urination is completed  -  Will send urine for culture as she is still symptomatic  -  Encouraged the patient to stop drinking [facility]   3. LUTS  - no etiology for her urinary symptoms are found on CT Urogram  - see above  Return for pending urine culture.  These notes generated with voice recognition software. I apologize for typographical errors.  Zara Council, [facility] [address] [place], [place] [address] [phone]

## 2016-03-11 ENCOUNTER — Encounter: Payer: Self-pay | Admitting: Urology

## 2016-03-11 ENCOUNTER — Ambulatory Visit (INDEPENDENT_AMBULATORY_CARE_PROVIDER_SITE_OTHER): Payer: BLUE CROSS/BLUE SHIELD | Admitting: Urology

## 2016-03-11 VITALS — BP 108/73 | HR 67 | Ht 68.0 in | Wt 138.2 lb

## 2016-03-11 DIAGNOSIS — R3129 Other microscopic hematuria: Secondary | ICD-10-CM

## 2016-03-11 DIAGNOSIS — R399 Unspecified symptoms and signs involving the genitourinary system: Secondary | ICD-10-CM | POA: Diagnosis not present

## 2016-03-11 DIAGNOSIS — R102 Pelvic and perineal pain: Secondary | ICD-10-CM

## 2016-03-11 LAB — URINALYSIS, COMPLETE
Bilirubin, UA: NEGATIVE
GLUCOSE, UA: NEGATIVE
Ketones, UA: NEGATIVE
Leukocytes, UA: NEGATIVE
NITRITE UA: NEGATIVE
PH UA: 6.5 (ref 5.0–7.5)
PROTEIN UA: NEGATIVE
RBC, UA: NEGATIVE
Specific Gravity, UA: 1.02 (ref 1.005–1.030)
UUROB: 0.2 mg/dL (ref 0.2–1.0)

## 2016-03-11 LAB — MICROSCOPIC EXAMINATION: Epithelial Cells (non renal): >10 /hpf — AB (ref 0–10)

## 2016-03-13 LAB — CULTURE, URINE COMPREHENSIVE

## 2016-03-16 ENCOUNTER — Telehealth: Payer: Self-pay

## 2016-03-16 NOTE — Telephone Encounter (Signed)
Pt called and I read message from [facility], [place].  She will come by to get samples.

## 2016-03-16 NOTE — Telephone Encounter (Signed)
-----   Message from Nori Riis, PA-C sent at 03/14/2016  7:56 PM EST ----- Please notify the patient that her urine culture was negative.  If she is still symptomatic, I suggest she start Toviaz 4 mg daily and RTC in 3 weeks.  We can give her samples of Toviaz.

## 2016-03-16 NOTE — Telephone Encounter (Signed)
Samples were left up front.  

## 2016-03-16 NOTE — Telephone Encounter (Signed)
LMOM

## 2016-04-23 ENCOUNTER — Encounter: Payer: Self-pay | Admitting: Urology

## 2016-04-23 ENCOUNTER — Ambulatory Visit (INDEPENDENT_AMBULATORY_CARE_PROVIDER_SITE_OTHER): Payer: BLUE CROSS/BLUE SHIELD | Admitting: Urology

## 2016-04-23 ENCOUNTER — Telehealth: Payer: Self-pay | Admitting: Urology

## 2016-04-23 ENCOUNTER — Telehealth: Payer: Self-pay

## 2016-04-23 VITALS — BP 119/72 | Ht 68.0 in | Wt 139.7 lb

## 2016-04-23 DIAGNOSIS — R399 Unspecified symptoms and signs involving the genitourinary system: Secondary | ICD-10-CM

## 2016-04-23 DIAGNOSIS — R3129 Other microscopic hematuria: Secondary | ICD-10-CM

## 2016-04-23 DIAGNOSIS — R102 Pelvic and perineal pain: Secondary | ICD-10-CM | POA: Diagnosis not present

## 2016-04-23 DIAGNOSIS — R079 Chest pain, unspecified: Secondary | ICD-10-CM

## 2016-04-23 DIAGNOSIS — N390 Urinary tract infection, site not specified: Secondary | ICD-10-CM

## 2016-04-23 LAB — URINALYSIS, COMPLETE
Bilirubin, UA: NEGATIVE
Glucose, UA: NEGATIVE
Ketones, UA: NEGATIVE
Nitrite, UA: POSITIVE — AB
Specific Gravity, UA: 1.02 (ref 1.005–1.030)
Urobilinogen, Ur: 1 mg/dL (ref 0.2–1.0)
pH, UA: 7 (ref 5.0–7.5)

## 2016-04-23 LAB — MICROSCOPIC EXAMINATION
RBC MICROSCOPIC, UA: NONE SEEN /HPF (ref 0–?)
WBC, UA: >30 /hpf — ABNORMAL HIGH (ref 0–?)

## 2016-04-23 MED ORDER — AMOXICILLIN-POT CLAVULANATE 875-125 MG PO TABS: 1.0000 | ORAL_TABLET | Freq: Two times a day (BID) | ORAL | 0 refills | Status: DC

## 2016-04-23 NOTE — Telephone Encounter (Signed)
A referral has been placed for pt to see Dr. Marcelline Mates for chest discomfort.

## 2016-04-23 NOTE — Progress Notes (Signed)
04/23/2016 10:59 PM   Monica Price 07-Feb-1990 [id number]  Referring provider: Rubie Maid, MD [place], [place] [address]  Chief Complaint  Patient presents with  . Follow-up    patient states having bladder and urethral pain    HPI: 26 yo WF who Presents today requesting an urgent appointment for dysuria, pain in the bladder and urethra.  Background history Patient was a referral from their PCP, Dr. Marcelline Mates, for microscopic hematuria.  Patient was found to have microscopic hematuria on 11/07/2015 with 11-30 RBC's/hpf and 10/01/2015 with 3-10 RBC's/hpf.  Urine culture was negative on the 10/01/2015.  Patient doesn't have a prior history of microscopic hematuria.  She does not have a prior history of recurrent urinary tract infections, nephrolithiasis, trauma to the genitourinary tract or malignancies of the genitourinary tract.  Her maternal grandmother has a history of stones and two great aunts with bladder cancer.   She complained of frequent urination, dysuria, nocturia, incontinence, intermittency, straining to urinate and a weak urinary stream.  She also experiencing pain with sex, but she has not been sexually active since July 2017.  Her UA  demonstrated 3-10 RBC's/hpf.  These symptoms have been occurring since June 2017.  She had been treated for a yeast infection and an UTI, but her symptoms did not improve.  She is experiencing suprapubic pain, but she denies abdominal pain or flank pain.  She denies any recent fevers, chills, nausea or vomiting.   She has not had any recent imaging studies.   She is not a smoker.  She is not exposed to secondhand smoke.  She does not work with [employer].  Her CT Urogram performed on 12/09/2015 noted no nephroureterolithiasis. No hydronephrosis. No suspicious filling defects identified within the opacified portions of the renal collecting systems and ureters.  Right renal cyst.  Bilateral pars defects, L5.     At her visit in February, she was complaining of urgency x 3, frequency x 7, incontinence x 3 and nocturia x 3.  She was still experiencing a slight twinge when she completed urination.  These symptoms persisted in spite a 3 month regimen of daily Macrobid.  She was not experiencing fevers, chills, nausea or vomiting.  She drinks 32 ounces of water daily, but she is drinking [employer] every other day.  Her UA did not demonstrate no microscopic hematuria.   She was given Toviaz samples which helped her symptoms in the beginning, but symptoms returned.    5 days ago, she had the onset of frequent urination, dysuria, nocturia, intermittency and a weak urinary stream. She states the pain in the bladder and urethra have returned and the pain is consistent. She denied any gross hematuria or flank pain. She denied any fevers, chills, nausea or vomiting. Her UA today noted greater than 30 WBCs, many bacteria and it was nitrate positive.  She also mentioned that she had been experiencing chest pain for the last 5 days. She states that she did not experience chest pain when she was walking or engage in strenuous activity. She has not experienced shortness of breath. She did not note if the pain recurred before after eating. She describes the pain as a burning sensation in the epigastric area.  PMH: Past Medical History:  Diagnosis Date  . Microscopic hematuria   . Recurrent UTI    Pt has been treated for kidney infection and UTI for 10 months    Surgical History: Past Surgical  History:  Procedure Laterality Date  . WISDOM TOOTH EXTRACTION      Home Medications:  Allergies as of 04/23/2016   No Known Allergies     Medication List       Accurate as of 04/23/16 11:59 PM. Always use your most recent med list.          amoxicillin-clavulanate 875-125 MG tablet Commonly known as:  AUGMENTIN Take 1 tablet by mouth every 12 (twelve) hours.   ANTIHISTAMINE PO Take by mouth daily as needed.    nitrofurantoin (macrocrystal-monohydrate) 100 MG capsule Commonly known as:  MACROBID Take 1 capsule (100 mg total) by mouth every 12 (twelve) hours.       Allergies: No Known Allergies  Family History: Family History  Problem Relation Age of Onset  . Bladder Cancer Maternal Aunt     great  . Bladder Cancer Maternal Uncle     great  . Kidney disease Neg Hx   . Prostate cancer Neg Hx   . Kidney cancer Neg Hx     Social History:  reports that she has never smoked. She has never used smokeless tobacco. She reports that she does not drink alcohol or use drugs.  ROS: UROLOGY Frequent Urination?: Yes Hard to postpone urination?: No Burning/pain with urination?: Yes Get up at night to urinate?: Yes Leakage of urine?: No Urine stream starts and stops?: Yes Trouble starting stream?: No Do you have to strain to urinate?: No Blood in urine?: No Urinary tract infection?: No Sexually transmitted disease?: No Injury to kidneys or bladder?: No Painful intercourse?: No Weak stream?: Yes Currently pregnant?: No Vaginal bleeding?: No Last menstrual period?: n  Gastrointestinal Nausea?: No Vomiting?: No Indigestion/heartburn?: No Diarrhea?: No Constipation?: No  Constitutional Fever: No Night sweats?: No Weight loss?: No Fatigue?: No  Skin Skin rash/lesions?: No Itching?: No  Eyes Blurred vision?: No Double vision?: No  Ears/Nose/Throat Sore throat?: No Sinus problems?: No  Hematologic/Lymphatic Swollen glands?: No Easy bruising?: No  Cardiovascular Leg swelling?: No Chest pain?: Yes  Respiratory Cough?: No Shortness of breath?: No  Endocrine Excessive thirst?: No  Musculoskeletal Back pain?: No Joint pain?: No  Neurological Headaches?: No Dizziness?: No  Psychologic Depression?: No Anxiety?: No  Physical Exam: BP 119/72   Ht 5\' 8"  (1.727 m)   Wt 139 lb 11.2 oz (63.4 kg)   LMP 04/19/2016   BMI 21.24 kg/m   Constitutional: Well  nourished. Alert and oriented, No acute distress. HEENT: [place] AT, moist mucus membranes. Trachea midline, no masses. Cardiovascular: No clubbing, cyanosis, or edema. Respiratory: Normal respiratory effort, no increased work of breathing. GI: Abdomen is soft, non tender, non distended, no abdominal masses. Liver and spleen not palpable.  No hernias appreciated.  Stool sample for occult testing is not indicated.   GU: No CVA tenderness.  No bladder fullness or masses.   Skin: No rashes, bruises or suspicious lesions. Lymph: No cervical or inguinal adenopathy. Neurologic: Grossly intact, no focal deficits, moving all 4 extremities. Psychiatric: Normal mood and affect.  Laboratory Data: Lab Results  Component Value Date   WBC 5.7 04/24/2016   HGB 13.7 04/24/2016   HCT 40.5 04/24/2016   MCV 91.9 04/24/2016   PLT 169 04/24/2016    Lab Results  Component Value Date   CREATININE 0.68 04/24/2016    Urinalysis > 30 WBC's, many bacteria and nitrite positive.  See EPIC.    Pertinent Imaging CLINICAL DATA:  Patient with history of microscopic hematuria.  EXAM: CT ABDOMEN  AND PELVIS WITHOUT AND WITH CONTRAST  TECHNIQUE: Multidetector CT imaging of the abdomen and pelvis was performed following the standard protocol before and following the bolus administration of intravenous contrast.  CONTRAST:  128mL ISOVUE-300 IOPAMIDOL (ISOVUE-300) INJECTION 61%  COMPARISON:  None.  FINDINGS: Lower chest: Normal heart size. No consolidative or nodular pulmonary opacities. No pleural effusion.  Hepatobiliary: Liver is normal in size and contour. No focal hepatic lesion is identified. Gallbladder is unremarkable.  Pancreas: Unremarkable  Spleen: Unremarkable  Adrenals/Urinary Tract: The adrenal glands are normal. Kidneys enhance symmetrically with contrast. Within the interpolar region of the right kidney there is a 1.5 cm nonenhancing lesion most compatible with a cyst.  Noncontrast images demonstrate no nephroureterolithiasis. No hydronephrosis. Delayed images demonstrate excretion of contrast material into the bilateral renal collecting systems, ureters and bladder. The opacified portions are unremarkable. The mid and distal right ureter is not opacified and therefore not well assessed.  Stomach/Bowel: No abnormal bowel wall thickening or evidence for bowel obstruction. No free fluid or free intraperitoneal air.  Vascular/Lymphatic: Normal caliber abdominal aorta. No retroperitoneal lymphadenopathy.  Reproductive: Uterus and adnexal structures are unremarkable.  Other: None.  Musculoskeletal: No aggressive or acute appearing osseous lesions. Bilateral L5 pars defects.  IMPRESSION: No nephroureterolithiasis. No hydronephrosis. No suspicious filling defects identified within the opacified portions of the renal collecting systems and ureters.  Right renal cyst.  Bilateral pars defects, L5.   Electronically Signed   By: Lovey Newcomer M.D.   On: 12/10/2015 09:14   Assessment & Plan:    1. Microscopic hematuria  - resolved  - CT Urogram did not identify any etiology for her hematuria  2. Recurrent UTI  - Urinalysis suspicious for infection, we'll start Augmentin empirically we'll adjust once culture and sensitivities are available if necessary  - Urinalysis, Complete  - CULTURE, URINE COMPREHENSIVE   3. Suprapubic pain  -  CT Urogram did not identify any etiology for her suprapubic pain - 3 months of Macrobid did not relieve symptoms - Toviaz samples did not provide relief  -  Still experiencing irritation when urination is completed  -  Will send urine for culture as she is still symptomatic  -  Encouraged patient to continue avoiding [employer]  - Persistent symptoms with negative and positive urine cultures  - Failed short course of suppressive antibiotics and Toviaz  - patient to have an appointment with Dr. Matilde Sprang  for further evaluation   4. LUTS  - no etiology for her urinary symptoms are found on CT Urogram  - see above  Return for appointment wiht Dr. Matilde Sprang.  These notes generated with voice recognition software. I apologize for typographical errors.  Zara Council, [facility] [address] [place], [place] [address] [phone]

## 2016-04-23 NOTE — Telephone Encounter (Signed)
Would you call Dr. Andreas Blower office and make an appointment for her?  She is having chest pain that may be the result of reflux.

## 2016-04-24 ENCOUNTER — Emergency Department
Admission: EM | Admit: 2016-04-24 | Discharge: 2016-04-24 | Disposition: A | Discharge status: Home / Self Care | Payer: BLUE CROSS/BLUE SHIELD | Attending: Emergency Medicine | Admitting: Emergency Medicine

## 2016-04-24 ENCOUNTER — Emergency Department: Payer: BLUE CROSS/BLUE SHIELD

## 2016-04-24 ENCOUNTER — Encounter: Payer: Self-pay | Admitting: *Deleted

## 2016-04-24 ENCOUNTER — Telehealth: Payer: Self-pay | Admitting: Obstetrics and Gynecology

## 2016-04-24 DIAGNOSIS — R0602 Shortness of breath: Secondary | ICD-10-CM | POA: Diagnosis not present

## 2016-04-24 DIAGNOSIS — R1084 Generalized abdominal pain: Secondary | ICD-10-CM | POA: Insufficient documentation

## 2016-04-24 DIAGNOSIS — R1013 Epigastric pain: Secondary | ICD-10-CM | POA: Diagnosis not present

## 2016-04-24 HISTORY — DX: Urinary tract infection, site not specified: N39.0

## 2016-04-24 LAB — CBC
HCT: 40.5 % (ref 35.0–47.0)
Hemoglobin: 13.7 g/dL (ref 12.0–16.0)
MCH: 31 pg (ref 26.0–34.0)
MCHC: 33.7 g/dL (ref 32.0–36.0)
MCV: 91.9 fL (ref 80.0–100.0)
PLATELETS: 169 10*3/uL (ref 150–440)
RBC: 4.4 MIL/uL (ref 3.80–5.20)
RDW: 12.9 % (ref 11.5–14.5)
WBC: 5.7 10*3/uL (ref 3.6–11.0)

## 2016-04-24 LAB — URINALYSIS, COMPLETE (UACMP) WITH MICROSCOPIC
BILIRUBIN URINE: NEGATIVE
GLUCOSE, UA: NEGATIVE mg/dL
KETONES UR: NEGATIVE mg/dL
NITRITE: NEGATIVE
PH: 5 (ref 5.0–8.0)
Protein, ur: 30 mg/dL — AB
SPECIFIC GRAVITY, URINE: 1.006 (ref 1.005–1.030)

## 2016-04-24 LAB — HEPATIC FUNCTION PANEL
ALK PHOS: 41 U/L (ref 38–126)
ALT: 20 U/L (ref 14–54)
AST: 26 U/L (ref 15–41)
Albumin: 4.4 g/dL (ref 3.5–5.0)
BILIRUBIN TOTAL: 0.6 mg/dL (ref 0.3–1.2)
TOTAL PROTEIN: 7.3 g/dL (ref 6.5–8.1)

## 2016-04-24 LAB — BASIC METABOLIC PANEL
Anion gap: 5 (ref 5–15)
BUN: 10 mg/dL (ref 6–20)
CHLORIDE: 103 mmol/L (ref 101–111)
CO2: 29 mmol/L (ref 22–32)
CREATININE: 0.68 mg/dL (ref 0.44–1.00)
Calcium: 9.4 mg/dL (ref 8.9–10.3)
Glucose, Bld: 96 mg/dL (ref 65–99)
Potassium: 3.9 mmol/L (ref 3.5–5.1)
Sodium: 137 mmol/L (ref 135–145)

## 2016-04-24 LAB — TROPONIN I: Troponin I: <0.03 ng/mL (ref <0.03)

## 2016-04-24 LAB — LIPASE, BLOOD: Lipase: 25 U/L (ref 11–51)

## 2016-04-24 MED ORDER — FAMOTIDINE 20 MG PO TABS: 20.0000 mg | ORAL_TABLET | Freq: Two times a day (BID) | ORAL | 1 refills | Status: DC

## 2016-04-24 MED ORDER — GI COCKTAIL ~~LOC~~
30.0000 mL | Freq: Once | ORAL | Status: AC
Start: 2016-04-24 — End: 2016-04-24
  Administered 2016-04-24: 30 mL via ORAL
  Filled 2016-04-24: qty 30

## 2016-04-24 NOTE — ED Notes (Signed)
Pt could not provide urine sample in triage pt has specimen cup in the lobby.

## 2016-04-24 NOTE — Telephone Encounter (Signed)
Patient called to schedule an appointment today with Dr Marcelline Mates for chest pain. Per Patient she does not presently have a pcp - instructed patient to go to the ER immediately for evaluation of the chest pain

## 2016-04-24 NOTE — ED Notes (Signed)
FIRST NURSE: pt brought over from [facility] with reports of chest pain and shortness of breath on and off for six weeks and has been on treatment for a bladder infection for 17mths.

## 2016-04-24 NOTE — ED Provider Notes (Addendum)
[facility] Emergency Department Provider Note  ____________________________________________   I have reviewed the triage vital signs and the nursing notes.   HISTORY  Chief Complaint Chest Pain and Shortness of Breath    HPI Monica Price is a 26 y.o. female Who presents today complaining of she describes this chest pain but she indicates her epigastric abdominal region. Patient has a sharp discomfort that comes and goes in that region. Not exertional non-positional. She denies any fever or chills. Does not seem to be worse with food. Does not radiate. His been there most of the time for the last week or so. She has had no recent travel no PE or DVT history in herself or her family no leg swelling, she has had no exogenous estrogens. No birth control or depo since last July No recent surgery. No risk factors for PE. No early cardiac history in her family. The pain is not pleuritic. She states that she was not short of breath at all until this morning when the pain got "worse" and the pain seem to take her breath away but she does not have any primary pulmonary pathology that she can attest to specifically she does not have any pleuritic chest pain nor does she have any shortness of breath with exertion. She has no exertional symptoms. The pain sometimes happens in the middle the night, is worse when she lies down and seems to come up her chest at that time.    Past Medical History:  Diagnosis Date  . Microscopic hematuria   . Recurrent UTI    Pt has been treated for kidney infection and UTI for 10 months    There are no active problems to display for this patient.   Past Surgical History:  Procedure Laterality Date  . WISDOM TOOTH EXTRACTION      Prior to Admission medications   Medication Sig Start Date End Date Taking? Authorizing Provider  Norethindrone-Ethinyl Estradiol-Fe Biphas (LO LOESTRIN FE) 1 MG-10 MCG / 10 MCG tablet Take 1 tablet by mouth  daily. 05/31/15  Yes Historical Provider, MD  amoxicillin-clavulanate (AUGMENTIN) 875-125 MG tablet Take 1 tablet by mouth every 12 (twelve) hours. 04/23/16   Nori Riis, PA-C  nitrofurantoin, macrocrystal-monohydrate, (MACROBID) 100 MG capsule Take 1 capsule (100 mg total) by mouth every 12 (twelve) hours. Patient not taking: Reported on 04/23/2016 12/12/15   Larene Beach A McGowan, PA-C  Triprolidine-Pseudoephedrine (ANTIHISTAMINE PO) Take by mouth daily as needed.    Historical Provider, MD    Allergies Patient has no known allergies.  Family History  Problem Relation Age of Onset  . Bladder Cancer Maternal Aunt     great  . Bladder Cancer Maternal Uncle     great  . Kidney disease Neg Hx   . Prostate cancer Neg Hx   . Kidney cancer Neg Hx     Social History Social History  Substance Use Topics  . Smoking status: Never Smoker  . Smokeless tobacco: Never Used  . Alcohol use No    Review of Systems Constitutional: No fever/chills Eyes: No visual changes. ENT: No sore throat. No stiff neck no neck pain Cardiovascular: Dthe history of present illness Respiratory: Denies shortness of breath. Gastrointestinal:   no vomiting.  No diarrhea.  No constipation. Genitourinary: Negative for dysuria. Musculoskeletal: Negative lower extremity swelling Skin: Negative for rash. Neurological: Negative for severe headaches, focal weakness or numbness. 10-point ROS otherwise negative.  ____________________________________________   PHYSICAL EXAM:  VITAL SIGNS: ED  Triage Vitals  Enc Vitals Group     BP 04/24/16 1130 116/77     Pulse Rate 04/24/16 1130 86     Resp 04/24/16 1130 18     Temp 04/24/16 1130 99.2 F (37.3 C)     Temp Source 04/24/16 1130 Oral     SpO2 04/24/16 1130 100 %     Weight 04/24/16 1131 139 lb (63 kg)     Height 04/24/16 1131 5\' 8"  (1.727 m)     Head Circumference --      Peak Flow --      Pain Score 04/24/16 1130 8     Pain Loc --      Pain Edu? --       Excl. in [place]? --     Constitutional: Alert and oriented. Well appearing and in no acute distress. Eyes: Conjunctivae are normal. PERRL. EOMI. Head: Atraumatic. Nose: No congestion/rhinnorhea. Mouth/Throat: Mucous membranes are moist.  Oropharynx non-erythematous. Neck: No stridor.   Nontender with no meningismus Cardiovascular: Normal rate, regular rhythm. Grossly normal heart sounds.  Good peripheral circulation. Respiratory: Normal respiratory effort.  No retractions. Lungs CTAB. Chest: Mother there as chaperone, no obvious lesions noted, no shingles, nontender to palpation no crepitus no flail chest Abdominal: Soft and gastric tenderness is noted which reproduces the patient's pain. No distention. No guarding no rebound Back:  There is no focal tenderness or step off.  there is no midline tenderness there are no lesions noted. there is no CVA tenderness Musculoskeletal: No lower extremity tenderness, no upper extremity tenderness. No joint effusions, no DVT signs strong distal pulses no edema Neurologic:  Normal speech and language. No gross focal neurologic deficits are appreciated.  Skin:  Skin is warm, dry and intact. No rash noted. Psychiatric: Mood and affect are normal. Speech and behavior are normal.  ____________________________________________   LABS (all labs ordered are listed, but only abnormal results are displayed)  Labs Reviewed  URINALYSIS, COMPLETE (UACMP) WITH MICROSCOPIC - Abnormal; Notable for the following:       Result Value   Color, Urine RED (*)    APPearance CLOUDY (*)    Hgb urine dipstick LARGE (*)    Protein, ur 30 (*)    Leukocytes, UA SMALL (*)    Bacteria, UA FEW (*)    Squamous Epithelial / LPF TOO NUMEROUS TO COUNT (*)    All other components within normal limits  URINE CULTURE  BASIC METABOLIC PANEL  CBC  TROPONIN I  LIPASE, BLOOD  HEPATIC FUNCTION PANEL   ____________________________________________  EKG  I personally interpreted  any EKGs ordered by me or triage EKG shows normal sinus rhythm rate 84 bpm, no acute ST elevation or depression, nonspecific ST changes ____________________________________________  RADIOLOGY  I reviewed any imaging ordered by me or triage that were performed during my shift and, if possible, patient and/or family made aware of any abnormal findings. ____________________________________________   PROCEDURES  Procedure(s) performed: None  Procedures  Critical Care performed: None  ____________________________________________   INITIAL IMPRESSION / ASSESSMENT AND PLAN / ED COURSE  Pertinent labs & imaging results that were available during my care of the patient were reviewed by me and considered in my medical decision making (see chart for details).  Administration with here with epigastric reproducible abdominal discomfort which she describes as "chest pain". This is not actually chest pain it is in her upper abdomen. She has no right upper quadrant abdominal pain. There is nothing at this time  to suggest the patient is actually having chest pain. This is much more likely to be an epigastric abdominal is issue. She has no right upper quadrant tenderness to suggest that she has gallbladder disorder but we'll send their function test. She we will also send lipase to rule out pancreatitis. Patient has had pain for a week with a negative troponin and reproducible discomfort in a reassuring EKG I do not think this likely represents ACS nor I think serial troponins be of any utility. Patient has no risk factors for PE, and she has perk negative. I don't think there for further workup for PE is indicated. Patient is a chronic urinary tract infections and we do notice that is present, we are send a culture, patient has just been started on antibiotics andhas an appointment with a urologist again in a few days. This is not something that seems to be related to her chief complaint. She certainly  could be having some upset stomach from her antibiotics as a startedat the time of her antibiotic but she is having no diarrhea or anything to suggest C. Difficile. We will give her a GI cocktail and reassess.  ----------------------------------------- 2:20 PM on 04/24/2016 ----------------------------------------- Patient has 100% resolution of her epigastric abdominal pain after GI cocktail. She is eager to go home. Abdomen completely benign with no tenderness at this time. Mother certainly cannot rule out ACS, given her clinical picture I think it is certainly suggestive of what I thought it was to begin with, which is a GI abdominal discomfort. We will refer her to GI medicine and we will start her on antiacids. She'll follow closely with her primary care doctor. Patient does have ongoing urinary tract infection for which she is taking antibiotics. We'll send a culture but I will not change the antibiotic pending further urine culture and she is being closely followed by urology for this and will see them this week, according to her most recent positive culture which was in November, she is on the correct antibiotics    ____________________________________________   FINAL CLINICAL IMPRESSION(S) / ED DIAGNOSES  Final diagnoses:  None      This chart was dictated using voice recognition software.  Despite best efforts to proofread,  errors can occur which can change meaning.      Schuyler Amor, MD 04/24/16 [place], MD 04/24/16 [place], MD 04/24/16 1452

## 2016-04-24 NOTE — Discharge Instructions (Signed)
Take antacids as prescribed. Return to the emergency room for any new or worrisome symptoms including increased pain, chest pain, shortness of breath, blood or black stool, or anything of concern. Follow closely with her primary care doctor. We do noticed that you have an ongoing urinary tract infection, as you know, we have sent another culture, continue the amoxicillin and follow closely as directed. If you have fever, weakness, pain in your side, vomiting, refill worse in any significant way please of course return to the emergency department

## 2016-04-24 NOTE — ED Triage Notes (Signed)
Pt arrived to ED from home reporting centralized chest pain and SOB without a cough for 6 days. No fevers, no nausea, no vomiting reported. Pt reports having had lightheadedness and weakness. Pt able to speak in complete sentences in triage and is in no acute resp./ distress.   Pt also reports currently being treated with amoxicillin for a bladder infection that had been treated for the past 10 months. Pt has been placed on PO antibiotics 5 times and is currently being seen by [place] [facility].

## 2016-04-25 LAB — URINE CULTURE: CULTURE: NO GROWTH

## 2016-04-26 LAB — CULTURE, URINE COMPREHENSIVE

## 2016-04-27 ENCOUNTER — Ambulatory Visit (INDEPENDENT_AMBULATORY_CARE_PROVIDER_SITE_OTHER): Payer: BLUE CROSS/BLUE SHIELD | Admitting: Urology

## 2016-04-27 ENCOUNTER — Encounter: Payer: Self-pay | Admitting: Urology

## 2016-04-27 VITALS — BP 133/71 | HR 71 | Ht 68.0 in | Wt 138.0 lb

## 2016-04-27 DIAGNOSIS — N3020 Other chronic cystitis without hematuria: Secondary | ICD-10-CM

## 2016-04-27 MED ORDER — NITROFURANTOIN MACROCRYSTAL 100 MG PO CAPS: 100.0000 mg | ORAL_CAPSULE | Freq: Every day | ORAL | 11 refills | Status: DC

## 2016-04-27 NOTE — Progress Notes (Signed)
04/27/2016 9:43 AM   Monica Price September 16, 1990 [id number]  Referring provider: Rubie Maid, MD [facility] [address] [place], [place] [address]  Chief Complaint  Patient presents with  . Pelvic Pain    HPI: The patient saw Larene Beach for microscopic hematuria. She did not have stones in November on a CT urogram. She has lower urinary tract symptoms and dyspareunia. She was on daily Macrodantin and has been treated with Toviaz. The patient recently had cystitis symptoms. She has a positive culture. A second antibiotic has been called in but she was unaware of such. She had a CT hematuria workup that was negative  Today The patient has had bladder symptoms and vaginal symptoms since last summer. Symptoms were minimally better on daily Macrodantin but she still had a little bit of discomfort at the end of urination in the urethra. She has daily vaginal discomfort and no suprapubic discomfort. She voids every 1-2 hours and is continent. She gets up once a night to urinate. She has not been assessed by her local gynecologist. She does not have abnormal discharge. She does have vaginal itchiness.  She has never smoked. She has not had bladder surgery. She is not a diabetic.  Modifying factors: There are no other modifying factors  Associated signs and symptoms: There are no other associated signs and symptoms Aggravating and relieving factors: There are no other aggravating or relieving factors Severity: Moderate Duration: Persistent    PMH: Past Medical History:  Diagnosis Date  . Microscopic hematuria   . Recurrent UTI    Pt has been treated for kidney infection and UTI for 10 months    Surgical History: Past Surgical History:  Procedure Laterality Date  . WISDOM TOOTH EXTRACTION      Home Medications:  Allergies as of 04/27/2016   No Known Allergies     Medication List       Accurate as of 04/27/16  9:43 AM. Always use your most recent med list.            amoxicillin-clavulanate 875-125 MG tablet Commonly known as:  AUGMENTIN Take 1 tablet by mouth every 12 (twelve) hours.   famotidine 20 MG tablet Commonly known as:  PEPCID Take 20 mg by mouth 2 (two) times daily.   nitrofurantoin (macrocrystal-monohydrate) 100 MG capsule Commonly known as:  MACROBID Take 1 capsule (100 mg total) by mouth every 12 (twelve) hours.       Allergies: No Known Allergies  Family History: Family History  Problem Relation Age of Onset  . Bladder Cancer Maternal Aunt     great  . Bladder Cancer Maternal Uncle     great  . Kidney disease Neg Hx   . Prostate cancer Neg Hx   . Kidney cancer Neg Hx     Social History:  reports that she has never smoked. She has never used smokeless tobacco. She reports that she does not drink alcohol or use drugs.  ROS: UROLOGY Frequent Urination?: Yes Hard to postpone urination?: No Burning/pain with urination?: Yes Get up at night to urinate?: Yes Leakage of urine?: No Urine stream starts and stops?: Yes Trouble starting stream?: No Do you have to strain to urinate?: No Blood in urine?: No Urinary tract infection?: No Sexually transmitted disease?: No Injury to kidneys or bladder?: No Painful intercourse?: No Weak stream?: Yes Currently pregnant?: No Vaginal bleeding?: No Last menstrual period?: n  Gastrointestinal Nausea?: No Vomiting?: No Indigestion/heartburn?: No Diarrhea?: No Constipation?: No  Constitutional  Fever: No Night sweats?: No Weight loss?: No Fatigue?: No  Skin Skin rash/lesions?: No Itching?: No  Eyes Blurred vision?: No Double vision?: No  Ears/Nose/Throat Sore throat?: No Sinus problems?: No  Hematologic/Lymphatic Swollen glands?: No Easy bruising?: No  Cardiovascular Leg swelling?: No Chest pain?: No  Respiratory Cough?: No Shortness of breath?: No  Endocrine Excessive thirst?: No  Musculoskeletal Back pain?: No Joint pain?:  No  Neurological Headaches?: No Dizziness?: No  Psychologic Depression?: No Anxiety?: No  Physical Exam: BP 133/71   Pulse 71   Ht 5\' 8"  (1.727 m)   Wt 138 lb (62.6 kg)   LMP 04/19/2016 (Exact Date)   BMI 20.98 kg/m   Constitutional:  Alert and oriented, No acute distress. HEENT: [place] AT, moist mucus membranes.  Trachea midline, no masses. Cardiovascular: No clubbing, cyanosis, or edema. Respiratory: Normal respiratory effort, no increased work of breathing. GI: Abdomen is soft, nontender, nondistended, no abdominal masses GU: No CVA tenderness. The patient had good bladder support and no levator tenderness. Arguably the bladder may have been a bit sensitive Skin: No rashes, bruises or suspicious lesions. Lymph: No cervical or inguinal adenopathy. Neurologic: Grossly intact, no focal deficits, moving all 4 extremities. Psychiatric: Normal mood and affect.  Laboratory Data: Lab Results  Component Value Date   WBC 5.7 04/24/2016   HGB 13.7 04/24/2016   HCT 40.5 04/24/2016   MCV 91.9 04/24/2016   PLT 169 04/24/2016    Lab Results  Component Value Date   CREATININE 0.68 04/24/2016    No results found for: PSA  No results found for: TESTOSTERONE  No results found for: HGBA1C  Urinalysis    Component Value Date/Time   COLORURINE RED (A) 04/24/2016 1136   APPEARANCEUR CLOUDY (A) 04/24/2016 1136   APPEARANCEUR Cloudy (A) 04/23/2016 1020   LABSPEC 1.006 04/24/2016 1136   PHURINE 5.0 04/24/2016 1136   GLUCOSEU NEGATIVE 04/24/2016 1136   HGBUR LARGE (A) 04/24/2016 1136   BILIRUBINUR NEGATIVE 04/24/2016 1136   BILIRUBINUR Negative 04/23/2016 1020   KETONESUR NEGATIVE 04/24/2016 1136   PROTEINUR 30 (A) 04/24/2016 1136   NITRITE NEGATIVE 04/24/2016 1136   LEUKOCYTESUR SMALL (A) 04/24/2016 1136   LEUKOCYTESUR 3+ (A) 04/23/2016 1020    Pertinent Imaging: none  Assessment & Plan:  The patient has ongoing vaginal discomfort. She does have an element of urinary  tract infections and currently has a positive culture. She will fill this prescription. She could have interstitial cystitis. This was explained to her.  I thought it was very reasonable for the patient to stay on prophylaxis to make certain she does not get the eye bladder infection. The role of a hydrodistention was discussed as well as following up with her gynecologist.   A hydrodistention was discussed in detail. I will see her in 3 months on daily Macrodantin but she might move the appointment up if she can get in to see gynecology to rule out for instance bacterial vaginosis. Will start her prophylactic medication after her UTI is treated  There are no diagnoses linked to this encounter.  No Follow-up on file.  Reece Packer, MD  [facility] [address] [place], [place] [address] [phone]

## 2016-05-05 ENCOUNTER — Encounter: Payer: Self-pay | Admitting: Obstetrics and Gynecology

## 2016-05-05 ENCOUNTER — Ambulatory Visit (INDEPENDENT_AMBULATORY_CARE_PROVIDER_SITE_OTHER): Payer: BLUE CROSS/BLUE SHIELD | Admitting: Obstetrics and Gynecology

## 2016-05-05 VITALS — BP 101/58 | HR 71 | Ht 68.0 in | Wt 138.2 lb

## 2016-05-05 DIAGNOSIS — N390 Urinary tract infection, site not specified: Secondary | ICD-10-CM | POA: Diagnosis not present

## 2016-05-05 DIAGNOSIS — B379 Candidiasis, unspecified: Secondary | ICD-10-CM | POA: Diagnosis not present

## 2016-05-05 DIAGNOSIS — N760 Acute vaginitis: Secondary | ICD-10-CM | POA: Diagnosis not present

## 2016-05-05 MED ORDER — FLUCONAZOLE 150 MG PO TABS: 150.0000 mg | ORAL_TABLET | Freq: Once | ORAL | 3 refills | Status: AC

## 2016-05-05 NOTE — Patient Instructions (Signed)

## 2016-05-05 NOTE — Progress Notes (Signed)
    GYNECOLOGY PROGRESS NOTE  Subjective:    Patient ID: Monica Price, female    DOB: 1990-12-07, 26 y.o.   MRN: [id number]  HPI  Patient is a 26 y.o. G0P0000 female who presents for complaints of recurrent vaginitis.  Patient notes that she has had on and off symptoms since last year.  Was last treated for vaginitis in June 2017.  States that since that time, her symptoms have waxed and waned.  Reports that she has changed to a mild pH balanced soap, is taking a probiotic, and trying to consume more yogurt.  Notes that she has occasionally had a mild odor associated.  Reports vaginal discharge which is usually is thin, white.   Of note, patient is currently on prophylactic antibiotics (Macrobid) for her h/o recurrent UTIs.     The following portions of the patient's history were reviewed and updated as appropriate: allergies, current medications, past family history, past medical history, past social history, past surgical history and problem list.  Review of Systems Pertinent items noted in HPI and remainder of comprehensive ROS otherwise negative.   Objective:   Blood pressure (!) 101/58, pulse 71, height 5\' 8"  (1.727 m), weight 138 lb 3.2 oz (62.7 kg), last menstrual period 04/19/2016. General appearance: alert and no distress Abdomen: soft, non-tender; bowel sounds normal; no masses,  no organomegaly Pelvic: external genitalia normal, rectovaginal septum normal.  Vagina with scant thick white clumpy discharge, no odor.  Cervix normal appearing, no lesions and no motion tenderness.  Uterus mobile, nontender, normal shape and size.  Adnexae non-palpable, nontender bilaterally.     Assessment:   Recurrent vaginitis, suspected yeast infection (based on exam) Recurrent UTIs  Plan:   Recurrent vaginitis and recurrent UTIs.  On Macrobid suppression for h/o recurrent UTIs. Continued to re-educate on hygiene techniques. Recommend T-tree oil.  Will prescribe Diflucan, can use long term  therapy if needed.  Nuswab performed.    Monica Maid, MD [facility]

## 2016-05-08 LAB — NUSWAB VAGINITIS PLUS (VG+)
Candida albicans, NAA: NEGATIVE
Candida glabrata, NAA: NEGATIVE
Chlamydia trachomatis, NAA: NEGATIVE
Neisseria gonorrhoeae, NAA: NEGATIVE
Trich vag by NAA: NEGATIVE

## 2016-05-11 ENCOUNTER — Telehealth: Payer: Self-pay

## 2016-05-11 NOTE — Telephone Encounter (Signed)
Called pt no answer. LM for pt informing her of negative results.  

## 2016-05-11 NOTE — Telephone Encounter (Signed)
-----   Message from Rubie Maid, MD sent at 05/10/2016  5:17 PM EDT ----- Please inform of normal Nuswab results.

## 2016-08-03 ENCOUNTER — Ambulatory Visit (INDEPENDENT_AMBULATORY_CARE_PROVIDER_SITE_OTHER): Payer: BLUE CROSS/BLUE SHIELD | Admitting: Urology

## 2016-08-03 ENCOUNTER — Encounter: Payer: Self-pay | Admitting: Urology

## 2016-08-03 VITALS — BP 101/68 | HR 73 | Ht 68.0 in | Wt 139.0 lb

## 2016-08-03 DIAGNOSIS — N390 Urinary tract infection, site not specified: Secondary | ICD-10-CM | POA: Diagnosis not present

## 2016-08-03 LAB — URINALYSIS, COMPLETE
BILIRUBIN UA: NEGATIVE
GLUCOSE, UA: NEGATIVE
Ketones, UA: NEGATIVE
Leukocytes, UA: NEGATIVE
Nitrite, UA: NEGATIVE
PH UA: 6.5 (ref 5.0–7.5)
PROTEIN UA: NEGATIVE
RBC UA: NEGATIVE
Specific Gravity, UA: 1.015 (ref 1.005–1.030)
UUROB: 0.2 mg/dL (ref 0.2–1.0)

## 2016-08-03 LAB — MICROSCOPIC EXAMINATION
BACTERIA UA: NONE SEEN
Epithelial Cells (non renal): >10 /hpf — AB (ref 0–10)
RBC MICROSCOPIC, UA: NONE SEEN /HPF (ref 0–?)
WBC UA: NONE SEEN /HPF (ref 0–?)

## 2016-08-03 NOTE — Progress Notes (Signed)
08/03/2016 8:56 AM   Monica Price 1990/10/31 [id number]  Referring provider: Rubie Maid, [place], [place] [address]  Chief Complaint  Patient presents with  . Recurrent UTI  . Cystitis    HPI: The patient saw Larene Beach for microscopic hematuria. She did not have stones in November on a CT urogram. She has lower urinary tract symptoms and dyspareunia. She was on daily Macrodantin and has been treated with Toviaz. The patient recently had cystitis symptoms. She has a positive culture. A second antibiotic has been called in but she was unaware of such. She had a CT hematuria workup that was negative  The patient has had bladder symptoms and vaginal symptoms since last summer. Symptoms were minimally better on daily Macrodantin but she still had a little bit of discomfort at the end of urination in the urethra. She has daily vaginal discomfort and no suprapubic discomfort. She voids every 1-2 hours and is continent. She gets up once a night to urinate. She has not been assessed by her local gynecologist. She does not have abnormal discharge. She does have vaginal itchiness.    The patient has ongoing vaginal discomfort. She does have an element of urinary tract infections and currently has a positive culture. She will fill this prescription. She could have interstitial cystitis. This was explained to her.  I thought it was very reasonable for the patient to stay on prophylaxis to make certain she does not get another bladder infection. The role of a hydrodistention was discussed as well as following up with her gynecologist.   A hydrodistention was discussed in detail. I will see her in 3 months on daily Macrodantin but she might move the appointment up if she can get in to see gynecology to rule out for instance bacterial vaginosis. Will start her prophylactic medication after her UTI is treated  Today Frequency is stable. Clinically noninfected. Feeling much  better. No vaginal discomfort   PMH: Past Medical History:  Diagnosis Date  . Microscopic hematuria   . Recurrent UTI    Pt has been treated for kidney infection and UTI for 10 months    Surgical History: Past Surgical History:  Procedure Laterality Date  . WISDOM TOOTH EXTRACTION      Home Medications:  Allergies as of 08/03/2016   No Known Allergies     Medication List       Accurate as of 08/03/16  8:56 AM. Always use your most recent med list.          famotidine 20 MG tablet Commonly known as:  PEPCID Take 20 mg by mouth 2 (two) times daily.   nitrofurantoin 100 MG capsule Commonly known as:  MACRODANTIN Take 1 capsule (100 mg total) by mouth at bedtime.       Allergies: No Known Allergies  Family History: Family History  Problem Relation Age of Onset  . Bladder Cancer Maternal Aunt        great  . Bladder Cancer Maternal Uncle        great  . Kidney disease Neg Hx   . Prostate cancer Neg Hx   . Kidney cancer Neg Hx     Social History:  reports that she has never smoked. She has never used smokeless tobacco. She reports that she does not drink alcohol or use drugs.  ROS: UROLOGY Frequent Urination?: No Hard to postpone urination?: No Burning/pain with urination?: No Get up at night to urinate?: No  Leakage of urine?: No Urine stream starts and stops?: No Trouble starting stream?: No Do you have to strain to urinate?: No Blood in urine?: No Urinary tract infection?: No Sexually transmitted disease?: No Injury to kidneys or bladder?: No Painful intercourse?: No Weak stream?: No Currently pregnant?: No Vaginal bleeding?: No Last menstrual period?: n  Gastrointestinal Nausea?: No Vomiting?: No Indigestion/heartburn?: No Diarrhea?: No Constipation?: No  Constitutional Fever: No Night sweats?: No Weight loss?: No Fatigue?: No  Skin Skin rash/lesions?: No Itching?: No  Eyes Blurred vision?: No Double vision?:  No  Ears/Nose/Throat Sore throat?: No Sinus problems?: No  Hematologic/Lymphatic Swollen glands?: No Easy bruising?: No  Cardiovascular Leg swelling?: No Chest pain?: No  Respiratory Cough?: No Shortness of breath?: No  Endocrine Excessive thirst?: No  Musculoskeletal Back pain?: No Joint pain?: No  Neurological Headaches?: No Dizziness?: No  Psychologic Depression?: No Anxiety?: No  Physical Exam: BP 101/68 (BP Location: Left Arm, Patient Position: Sitting, Cuff Size: Normal)   Pulse 73   Ht 5\' 8"  (1.727 m)   Wt 139 lb (63 kg)   BMI 21.13 kg/m   Constitutional:  Alert and oriented, No acute distress.  Laboratory Data: Lab Results  Component Value Date   WBC 5.7 04/24/2016   HGB 13.7 04/24/2016   HCT 40.5 04/24/2016   MCV 91.9 04/24/2016   PLT 169 04/24/2016    Lab Results  Component Value Date   CREATININE 0.68 04/24/2016    No results found for: PSA  No results found for: TESTOSTERONE  No results found for: HGBA1C  Urinalysis    Component Value Date/Time   COLORURINE RED (A) 04/24/2016 1136   APPEARANCEUR CLOUDY (A) 04/24/2016 1136   APPEARANCEUR Cloudy (A) 04/23/2016 1020   LABSPEC 1.006 04/24/2016 1136   PHURINE 5.0 04/24/2016 1136   GLUCOSEU NEGATIVE 04/24/2016 1136   HGBUR LARGE (A) 04/24/2016 1136   BILIRUBINUR NEGATIVE 04/24/2016 1136   BILIRUBINUR Negative 04/23/2016 1020   KETONESUR NEGATIVE 04/24/2016 1136   PROTEINUR 30 (A) 04/24/2016 1136   NITRITE NEGATIVE 04/24/2016 1136   LEUKOCYTESUR SMALL (A) 04/24/2016 1136   LEUKOCYTESUR 3+ (A) 04/23/2016 1020    Pertinent Imaging: none  Assessment & Plan:  The patient is doing well on daily prophylaxis. I will reassess her in 6 months. This supports the clinical diagnosis  1. Recurrent UTI  - Urinalysis, Complete   No Follow-up on file.  Reece Packer, MD  [facility] [address] [place], [place] [address] [phone]

## 2016-10-06 ENCOUNTER — Ambulatory Visit (INDEPENDENT_AMBULATORY_CARE_PROVIDER_SITE_OTHER): Payer: BLUE CROSS/BLUE SHIELD | Admitting: Obstetrics and Gynecology

## 2016-10-06 ENCOUNTER — Encounter: Payer: Self-pay | Admitting: Obstetrics and Gynecology

## 2016-10-06 VITALS — BP 104/66 | HR 76 | Ht 68.0 in | Wt 138.4 lb

## 2016-10-06 DIAGNOSIS — Z87898 Personal history of other specified conditions: Secondary | ICD-10-CM | POA: Diagnosis not present

## 2016-10-06 DIAGNOSIS — Z113 Encounter for screening for infections with a predominantly sexual mode of transmission: Secondary | ICD-10-CM | POA: Diagnosis not present

## 2016-10-06 DIAGNOSIS — Z01419 Encounter for gynecological examination (general) (routine) without abnormal findings: Secondary | ICD-10-CM

## 2016-10-06 NOTE — Patient Instructions (Addendum)

## 2016-10-06 NOTE — Progress Notes (Signed)
GYNECOLOGY ANNUAL PHYSICAL EXAM PROGRESS NOTE  Subjective:    Monica Price is a 26 y.o. G0P0 female who presents for an annual exam.  The patient is sexually active. The patient wears seatbelts: yes. The patient participates in regular exercise: no. Has the patient ever been transfused or tattooed?: no. The patient reports that there is not domestic violence in her life.   The patient has the following complaints today: currently taking Macrobid daily for h/o dysuria, has been evaluated by Urology for chronic complaints of dysuria despite negative UA's and cultures. Patient notes that she is concerned that something more significant may be going on, as if she misses even 1 day of her antibiotic they symptoms return and are moderate-severe.  Has not yet discussed her concerns with Urology.  Also is wondering if her bladder problems could also be causing her issues with recurrent vaginitis, or vice versa.  Is currently asymptomatic with regards to vaginal symptoms, but still desires to be screened for "everything vaginal" today just to be sure.   Gynecologic History Menarche age: 4 Patient's last menstrual period was 09/26/2016. Period Cycle (Days): 26 Period Duration (Days): 7 Period Pattern: (!) Irregular Menstrual Flow: Moderate Dysmenorrhea: None  Contraception: vasectomy History of STI's: Denies  Last Pap: 08/2014. Results were: normal.  Denies h/o abnormal pap smears.    Obstetric History   G0   P0   T0   P0   A0   L0    SAB0   TAB0   Ectopic0   Multiple0   Live Births0       Past Medical History:  Diagnosis Date  . Microscopic hematuria   . Recurrent UTI    Pt has been treated for kidney infection and UTI for 10 months    Past Surgical History:  Procedure Laterality Date  . WISDOM TOOTH EXTRACTION      Family History  Problem Relation Age of Onset  . Bladder Cancer Maternal Aunt        great  . Bladder Cancer Maternal Uncle        great  . Kidney disease Neg  Hx   . Prostate cancer Neg Hx   . Kidney cancer Neg Hx     Social History   Social History  . Marital status: Single    Spouse name: N/A  . Number of children: N/A  . Years of education: N/A   Occupational History  . Not on file.   Social History Main Topics  . Smoking status: Never Smoker  . Smokeless tobacco: Never Used  . Alcohol use No  . Drug use: No  . Sexual activity: Yes    Birth control/ protection: None     Comment: Partner has vasectomy    Other Topics Concern  . Not on file   Social History Narrative  . No narrative on file    Current Outpatient Prescriptions on File Prior to Visit  Medication Sig Dispense Refill  . famotidine (PEPCID) 20 MG tablet Take 20 mg by mouth 2 (two) times daily.     No current facility-administered medications on file prior to visit.     No Known Allergies   Review of Systems Constitutional: negative for chills, fatigue, fevers and sweats Eyes: negative for irritation, redness and visual disturbance Ears, nose, mouth, throat, and face: negative for hearing loss, nasal congestion, snoring and tinnitus Respiratory: negative for asthma, cough, sputum Cardiovascular: negative for chest pain, dyspnea, exertional chest pressure/discomfort, irregular heart  beat, palpitations and syncope Gastrointestinal: negative for abdominal pain, change in bowel habits, nausea and vomiting Genitourinary: positive for chronic dysuria.  Negative for abnormal menstrual periods, genital lesions, sexual problems and vaginal discharge, and urinary incontinence Integument/breast: negative for breast lump, breast tenderness and nipple discharge Hematologic/lymphatic: negative for bleeding and easy bruising Musculoskeletal:negative for back pain and muscle weakness Neurological: negative for dizziness, headaches, vertigo and weakness Endocrine: negative for diabetic symptoms including polydipsia, polyuria and skin dryness Allergic/Immunologic: negative  for hay fever and urticaria     Objective:  Blood pressure 104/66, pulse 76, height 5\' 8"  (1.727 m), weight 138 lb 6.4 oz (62.8 kg), last menstrual period 09/26/2016. Body mass index is 21.04 kg/m.  General Appearance:    Alert, cooperative, no distress, appears stated age  Head:    Normocephalic, without obvious abnormality, atraumatic  Eyes:    PERRL, conjunctiva/corneas clear, EOM's intact, both eyes  Ears:    Normal external ear canals, both ears  Nose:   Nares normal, septum midline, mucosa normal, no drainage or sinus tenderness  Throat:   Lips, mucosa, and tongue normal; teeth and gums normal  Neck:   Supple, symmetrical, trachea midline, no adenopathy; thyroid: no enlargement/tenderness/nodules; no carotid bruit or JVD  Back:     Symmetric, no curvature, ROM normal, no CVA tenderness  Lungs:     Clear to auscultation bilaterally, respirations unlabored  Chest Wall:    No tenderness or deformity   Heart:    Regular rate and rhythm, S1 and S2 normal, no murmur, rub or gallop  Breast Exam:    No tenderness, masses, or nipple abnormality  Abdomen:     Soft, non-tender, bowel sounds active all four quadrants, no masses, no organomegaly.    Genitalia:    Pelvic:external genitalia normal, vagina without lesions, discharge, or tenderness, rectovaginal septum  normal. Cervix normal in appearance, no cervical motion tenderness, no adnexal masses or tenderness.  Uterus normal size, shape, mobile, regular contours, nontender.  Rectal:    Normal external sphincter.  No hemorrhoids appreciated. Internal exam not done.   Extremities:   Extremities normal, atraumatic, no cyanosis or edema  Pulses:   2+ and symmetric all extremities  Skin:   Skin color, texture, turgor normal, no rashes or lesions  Lymph nodes:   Cervical, supraclavicular, and axillary nodes normal  Neurologic:   CNII-XII intact, normal strength, sensation and reflexes throughout   .  Labs:  Lab Results  Component Value Date     WBC 5.7 04/24/2016   HGB 13.7 04/24/2016   HCT 40.5 04/24/2016   MCV 91.9 04/24/2016   PLT 169 04/24/2016    Lab Results  Component Value Date   CREATININE 0.68 04/24/2016   BUN 10 04/24/2016   NA 137 04/24/2016   K 3.9 04/24/2016   CL 103 04/24/2016   CO2 29 04/24/2016    Lab Results  Component Value Date   ALT 20 04/24/2016   AST 26 04/24/2016   ALKPHOS 41 04/24/2016   BILITOT 0.6 04/24/2016    No results found for: TSH   Assessment:    Routine gynecologic exam.   Dysuria  Plan:    Blood tests: None performed.  Up to date.  Breast self exam technique reviewed and patient encouraged to perform self-exam monthly. Contraception: vasectomy.  Inquires as to success rate of vasectomy reversal as she and partner may consider conception in the next several years.  Discussed average success rate of vasectomy.  Also discussed barrier  method  as the only method for prevention against STDs.  Discussed healthy lifestyle modifications. Dysuria (chronic) - currently being managed by Urology. Advised to discuss with them her concerns.  Nuswab VG+ (as well as mycoplasma) done today at patient's request for "full panel workup" for her h/o recurrent vaginitis as well as possible irritation for her bladder problems.  Follow up in 1 year.    Rubie Maid, MD [facility]

## 2016-10-11 LAB — NUSWAB VAGINITIS PLUS (VG+)
CANDIDA ALBICANS, NAA: NEGATIVE
Candida glabrata, NAA: NEGATIVE
Chlamydia trachomatis, NAA: NEGATIVE
NEISSERIA GONORRHOEAE, NAA: NEGATIVE
Trich vag by NAA: NEGATIVE

## 2016-10-11 LAB — GENITAL MYCOPLASMAS NAA, SWAB
Mycoplasma genitalium NAA: NEGATIVE
Mycoplasma hominis NAA: NEGATIVE
Ureaplasma spp NAA: NEGATIVE

## 2016-10-12 ENCOUNTER — Telehealth: Payer: Self-pay

## 2016-10-12 NOTE — Telephone Encounter (Signed)
Message left on voicemail of negative results.

## 2016-10-12 NOTE — Telephone Encounter (Signed)
-----   Message from Rubie Maid, MD sent at 10/11/2016  9:16 PM EDT ----- Ivin Booty,   Please inform patient of negative extended Nuswab panel.

## 2017-02-01 ENCOUNTER — Ambulatory Visit: Payer: Self-pay

## 2017-04-01 ENCOUNTER — Encounter: Payer: Self-pay | Admitting: Obstetrics and Gynecology

## 2017-04-01 ENCOUNTER — Ambulatory Visit (INDEPENDENT_AMBULATORY_CARE_PROVIDER_SITE_OTHER): Payer: BLUE CROSS/BLUE SHIELD | Admitting: Obstetrics and Gynecology

## 2017-04-01 VITALS — BP 97/56 | HR 76 | Ht 68.0 in | Wt 137.4 lb

## 2017-04-01 DIAGNOSIS — Z8742 Personal history of other diseases of the female genital tract: Secondary | ICD-10-CM

## 2017-04-01 DIAGNOSIS — T192XXA Foreign body in vulva and vagina, initial encounter: Secondary | ICD-10-CM

## 2017-04-01 DIAGNOSIS — N938 Other specified abnormal uterine and vaginal bleeding: Secondary | ICD-10-CM

## 2017-04-01 MED ORDER — MEDROXYPROGESTERONE ACETATE 10 MG PO TABS: 10.0000 mg | ORAL_TABLET | Freq: Every day | ORAL | 1 refills | Status: DC

## 2017-04-01 NOTE — Progress Notes (Signed)
    GYNECOLOGY PROGRESS NOTE  Subjective:    Patient ID: Monica Price, female    DOB: October 19, 1990, 27 y.o.   MRN: [id number]  HPI  Patient is a 27 y.o. G22P0000 female who presents for complaints of prolonged and intermittently heavy vaginal bleeding.  Patient's last menstrual period was 03/06/2017 (exact date).  She notes that she has been bleeding for the last 23 days and has a vaginal odor.  Bleeding alternates being heavy for 4-5 days with passage of clots, requiring use of both a pad every hour, then becomes lighter, sometimes only requiring the use of a pantyliner.  Her cycles have been irregular for at least the past 3 months, with cycles lasting between 4-18 days.  Has also sometimes had 2 periods in 1 month. She does report some mild dysmenorrhea managed with OTC pain meds.  Patient reports a long-time history of irregular menstrual cycles, usually having to be managed with some sort hormonal therapy (contraception), but she has not been on anything in a while as she wanted to let her body self-regulate.   The following portions of the patient's history were reviewed and updated as appropriate: allergies, current medications, past family history, past medical history, past social history, past surgical history and problem list.  Review of Systems Pertinent items noted in HPI and remainder of comprehensive ROS otherwise negative.   Objective:   Blood pressure (!) 97/56, pulse 76, height 5\' 8"  (1.727 m), weight 137 lb 6.4 oz (62.3 kg). General appearance: alert and no distress Abdomen: soft, non-tender; bowel sounds normal; no masses,  no organomegaly Pelvic: external genitalia normal, rectovaginal septum normal.  Vagina with retained tampon, removed. Small amount of dark brown blood in vaginal vault.   Cervix normal appearing, no lesions and no motion tenderness.  Uterus mobile, nontender, normal shape and size.  Adnexae non-palpable, nontender bilaterally.  Extremities: extremities normal,  atraumatic, no cyanosis or edema Neurologic: Grossly normal   Assessment:   Dysfunctional uterine bleeding Retained tampon H/o irregular menses  Plan:   - Retained tampon removed on today's exam.  - Will order labs to assess for cause of patient's dysfunctional uterine bleeding and h/o irregular menses, including possible PCOS.  - Will supplement with progesterone, prescribed Provera 10 mg to take monthly.  She notes that she does not want to be on contraceptives for bleeding at this time. Is currently abstinent so is not concerned regarding contraception management.  - To f/u if symptoms worsen or fail to improve.  Otherwise, will f/u with patient at her annual exam scheduled in several months.    Monica Maid, MD [facility] 04/03/2017 2:40 PM

## 2017-04-01 NOTE — Patient Instructions (Signed)
Ch?y mu t? cung do r?i lo?n c? n?ng (Dysfunctional Uterine Bleeding) Ch?y mu t? cung do r?i lo?n c? n?ng l ch?y mu b?t th??ng ? t? cung. Ch?y mu t? cung do r?i lo?n c? n?ng bao g?m:  K? kinh nguy?t ??n s?m h?n ho?c mu?n h?n bnh th??ng.  K? kinh nguy?t nh? h?n, n?ng h?n, ho?c c cc c?c mu ?ng.  Ch?y mu gi?a cc chu k? kinh nguy?t.  B? qua m?t ho?c nhi?u k? kinh nguy?t.  Ch?y mu sau khi giao h?p.  Ch?y mu sau khi mn kinh. H??NG D?N CH?M [place] T?I NH Ch  ??n nh?ng thay ??i v? tri?u ch?ng c?a qu v?. Lm theo nh?ng h??ng d?n sau ?? gip c?i thi?n tnh tr?ng c?a qu v?: ?n  ?n nh?ng th?c ?n cn b?ng. ?n nh?ng th?c ?n giu ch?t s?t, ch?ng h?n nh? gan, th?t, ??ng v?t c v?, rau l xanh v tr?ng.  N?u qu v? b? to bn: ? U?ng th?t nhi?u n??c. ? ?n nh?ng lo?i tri cy v rau c nhi?u n??c v ch?t x?, ch?ng h?n rau bina (spinach), c r?t, qu? mm xi, to v xoi. Thu?c  Ch? s? d?ng thu?c khng c?n k ??n v thu?c c?n k ??n theo ch? d?n c?a chuyn gia ch?m [place] s?c kh?e.  Khng thay ??i thu?c m khng h?i  ki?n c?a chuyn gia ch?m [place] s?c kh?e.  Aspirin ho?c cc thu?c c ch?a aspirin c th? lm ch?y mu n?ng h?n. Khng dng nh?ng lo?i thu?c ?: ? Trong tu?n tr??c khi ??n k? kinh nguy?t. ? Trong k? kinh nguy?t.  N?u qu v? ???c k vin thu?c s?t, hy dng thu?c theo ch? d?n c?a chuyn gia ch?m [place] s?c kh?e. Vin thu?c s?t gip thay th? l??ng s?t m c? th? qu v? ? b? m?t do tnh tr?ng ny. Ho?t ??ng  N?u qu v? c?n thay b?ng v? sinh ho?c nt b?ng v? sinh nhi?u l?n trong m?i 2 gi?: ? N?m trn gi??ng v k cao chn (nng cao). ? ??t m?t ti ch??m l?nh ln ph?n b?ng d??i c?a qu v?. ? Ngh? ng?i cng nhi?u cng t?t cho ??n khi ch?y mu d?ng l?i ho?c ch?m l?i.  Khng c? g?ng gi?m cn cho ??n khi tnh tr?ng ch?y mu d?ng l?i v n?ng ?? s?t trong mu c?a qu v? tr? l?i bnh th??ng. Nh?ng h??ng d?n khc  Trong hai thng, ghi l?i: ? Khi no chu k? kinh nguy?n c?a qu v? b?t  ??u. ? Khi no chu k? kinh nguy?t c?a qu v? k?t thc. ? Khi no vi?c ch?y mu b?t th??ng x?y ra. ? Qu v? nh?n th?y v?n ?? g.  Tun th? t?t c? cc cu?c h?n khm l?i theo ch? d?n c?a chuyn gia ch?m [place] s?c kh?e. ?i?u ny c vai tr quan tr?ng. ?I KHM N?U:  Qu v? b? chong vng ho?c y?u ?t.  Qu v? b? bu?n nn v nn m?a.  Qu v? khng th? ?n ho?c u?ng m khng b? nn.  Qu v? c?m th?y chng m?t ho?c b? tiu ch?y khi qu v? ?ang dng thu?c.  Qu v? ?ang dng vin thu?c trnh [place] ho?c hoc-mn, v qu v? mu?n thay th? ho?c d?ng s? d?ng chng. NGAY L?P T?C ?I KHM N?U:  Qu v? b? s?t ho?c ?n l?nh.  Qu v? c?n thay b?ng v? sinh ho?c nt b?ng v? sinh ho?c nt b?ng v? sinh nhi?u l?n trong m?i gi?:  Qu v?  ch?y mu n?ng h?n, ho?c dng mu c ch?a cc c?c mu ?ng th??ng xuyn h?n.  Qu v? b? ?au b?ng.  Qu v? b? b?t t?nh.  Qu v? b? pht ban. Thng tin ny khng nh?m m?c ?ch thay th? cho l?i khuyn m chuyn gia ch?m [place] s?c kh?e ni v?i qu v?. Hy b?o ??m qu v? ph?i th?o lu?n b?t k? v?n ?? g m qu v? c v?i chuyn gia ch?m [place] s?c kh?e c?a qu v?. Document Released: 12/29/2004 Document Revised: 09/19/2014 Document Reviewed: 03/26/2014 Elsevier Interactive Patient Education  2018 [employer].

## 2017-04-01 NOTE — Progress Notes (Signed)
Pt is having severe heavy bleeding with dark blood with odor.

## 2017-04-10 LAB — TESTOSTERONE, FREE, TOTAL, SHBG
Sex Hormone Binding: 76.9 nmol/L (ref 24.6–122.0)
TESTOSTERONE FREE: 0.7 pg/mL (ref 0.0–4.2)
TESTOSTERONE: 34 ng/dL (ref 8–48)

## 2017-04-10 LAB — PROGESTERONE: PROGESTERONE: 0.1 ng/mL

## 2017-04-10 LAB — TSH: TSH: 0.977 u[IU]/mL (ref 0.450–4.500)

## 2017-04-10 LAB — INSULIN, FREE AND TOTAL
Free Insulin: 5.9 uU/mL
TOTAL INSULIN: 6 uU/mL

## 2017-04-10 LAB — FSH/LH
FSH: 5.2 m[IU]/mL
LH: 25.9 m[IU]/mL

## 2017-04-10 LAB — ESTRADIOL: ESTRADIOL: 45.9 pg/mL

## 2017-04-13 ENCOUNTER — Telehealth: Payer: Self-pay | Admitting: Obstetrics and Gynecology

## 2017-04-13 NOTE — Telephone Encounter (Signed)
The patient called and stated that she is currently taking medroxyPROGESTERone (PROVERA) 10 MG tablet, The patient believes it may have caused her to start bleeding and wants to know if she should continue the use of the medication. The patient has some questions for Dr. Marcelline Mates or her nurse. Please advise.

## 2017-04-15 NOTE — Telephone Encounter (Signed)
Pt called back no answer LM to call the office.

## 2017-08-19 ENCOUNTER — Encounter: Payer: Self-pay | Admitting: Primary Care

## 2017-08-19 ENCOUNTER — Encounter (INDEPENDENT_AMBULATORY_CARE_PROVIDER_SITE_OTHER): Payer: Self-pay

## 2017-08-19 ENCOUNTER — Ambulatory Visit (INDEPENDENT_AMBULATORY_CARE_PROVIDER_SITE_OTHER): Payer: BLUE CROSS/BLUE SHIELD | Admitting: Primary Care

## 2017-08-19 VITALS — BP 110/68 | HR 83 | Temp 98.3°F | Ht 68.0 in | Wt 141.0 lb

## 2017-08-19 DIAGNOSIS — Z30011 Encounter for initial prescription of contraceptive pills: Secondary | ICD-10-CM | POA: Insufficient documentation

## 2017-08-19 DIAGNOSIS — N3010 Interstitial cystitis (chronic) without hematuria: Secondary | ICD-10-CM | POA: Insufficient documentation

## 2017-08-19 LAB — POCT URINE PREGNANCY: Preg Test, Ur: NEGATIVE

## 2017-08-19 MED ORDER — NITROFURANTOIN MACROCRYSTAL 100 MG PO CAPS: 100.0000 mg | ORAL_CAPSULE | Freq: Every day | ORAL | 0 refills | Status: DC

## 2017-08-19 MED ORDER — NORETHIN-ETH ESTRAD-FE BIPHAS 1 MG-10 MCG / 10 MCG PO TABS: 1.0000 | ORAL_TABLET | Freq: Every day | ORAL | 0 refills | Status: DC

## 2017-08-19 NOTE — Patient Instructions (Signed)
Schedule a follow up visit with Natasha Mead at [facility].  Start the birth control pills on August 25th as discussed.  Congratulations on your upcoming wedding!  It was a pleasure to see you today!

## 2017-08-19 NOTE — Assessment & Plan Note (Signed)
Requesting to start OCP's in order to prevent menstrual cycle during her wedding and honeymoon. Urine pregnancy negative today.   Rx for Lo Loestrin sent to pharmacy. We discussed a start date of August 25 th in order to coordinate with her wedding and honeymoon.

## 2017-08-19 NOTE — Progress Notes (Signed)
Subjective:    Patient ID: Monica Price, female    DOB: April 23, 1990, 27 y.o.   MRN: [id number]  HPI  Monica Price is a 27 year old female who presents today to establish care and discuss the problems mentioned below. Will obtain old records.  1) Interstitial Cystitis: Diagnosed in 2017. Initially evaluated due to findings of microscopic hematuria in October 2017. She's undergone CT Urogram which was unremarkable. She has had several urinary tract infections with one positive culture and has been treated with several rounds of antibiotics. She has a history of both urinary and vaginal symptoms including daily vaginal and suprapubic discomfort. She has been evaluated by GYN.   She is managed on Macrobid for which she is taking for 2-3 days in a row, then will skip a few days, then resume. She's been taking Macrobid for 2 years. If she goes more than 3-4 days without her Macrobid then she'll feel symptoms of dysuria, pelvic pressure.   She was following with [facility], then established care through [facility] and underwent cystoscopy which was normal.. She was told it would be safe to continue her Macrobid daily. She was referred to an interstitial cystitis specialist for continued symptoms despite normal workup, she never followed through.   She was once managed on Toviaz samples in the past, thinks this may have helped with her symptoms. She plans on re-establishing with [facility] soon.  2) Oral Contraceptive Counseling: History of heavy and frequent menstrual cycles since the age of 17. Will have two menstrual cycles monthly. Previously managed on OCP's, but no recent use. She is getting married next month and would like to be off of her menstrual cycle. She would like to start OCP's again, did well on Lo Loestrin FE. LMP July 25th.   Review of Systems  Respiratory: Negative for shortness of breath.   Cardiovascular: Negative for chest pain.  Genitourinary: Positive for menstrual  problem. Negative for flank pain, frequency and vaginal discharge.       History of dysuria and suprapubic pressure. No current symptoms.       Past Medical History:  Diagnosis Date  . Microscopic hematuria   . Recurrent UTI    Pt has been treated for kidney infection and UTI for 10 months     Social History   Socioeconomic History  . Marital status: Single    Spouse name: Not on file  . Number of children: Not on file  . Years of education: Not on file  . Highest education level: Not on file  Occupational History  . Not on file  Social Needs  . Financial resource strain: Not on file  . Food insecurity:    Worry: Not on file    Inability: Not on file  . Transportation needs:    Medical: Not on file    Non-medical: Not on file  Tobacco Use  . Smoking status: Never Smoker  . Smokeless tobacco: Never Used  Substance and Sexual Activity  . Alcohol use: No  . Drug use: No  . Sexual activity: Yes    Birth control/protection: None    Comment: Partner has vasectomy   Lifestyle  . Physical activity:    Days per week: Not on file    Minutes per session: Not on file  . Stress: Not on file  Relationships  . Social connections:    Talks on phone: Not on file    Gets together: Not on file    Attends  religious service: Not on file    Active member of club or organization: Not on file    Attends meetings of clubs or organizations: Not on file    Relationship status: Not on file  . Intimate partner violence:    Fear of current or ex partner: Not on file    Emotionally abused: Not on file    Physically abused: Not on file    Forced sexual activity: Not on file  Other Topics Concern  . Not on file  Social History Narrative  . Not on file    Past Surgical History:  Procedure Laterality Date  . WISDOM TOOTH EXTRACTION      Family History  Problem Relation Age of Onset  . Bladder Cancer Maternal Aunt        great  . Bladder Cancer Maternal Uncle        great  .  Kidney disease Neg Hx   . Prostate cancer Neg Hx   . Kidney cancer Neg Hx     No Known Allergies  No current outpatient medications on file prior to visit.   No current facility-administered medications on file prior to visit.     BP 110/68   Pulse 83   Temp 98.3 F (36.8 C) (Oral)   Ht 5\' 8"  (1.727 m)   Wt 141 lb (64 kg)   LMP 08/05/2017   SpO2 98%   BMI 21.44 kg/m    Objective:   Physical Exam  Constitutional: She appears well-nourished.  Neck: Neck supple.  Cardiovascular: Normal rate and regular rhythm.  Respiratory: Effort normal and breath sounds normal.  Skin: Skin is warm and dry.  Psychiatric: She has a normal mood and affect.           Assessment & Plan:

## 2017-08-19 NOTE — Assessment & Plan Note (Signed)
Diagnosed in 2017, normal work up per [facility] and also [facility]. Refilled Macrobid as it is helping with symptoms. Unsure if this is best practice so we did discuss for her to follow up with [facility]. She agrees and will schedule.

## 2017-08-27 ENCOUNTER — Telehealth: Payer: Self-pay | Admitting: Obstetrics and Gynecology

## 2017-08-27 DIAGNOSIS — Z30011 Encounter for initial prescription of contraceptive pills: Secondary | ICD-10-CM

## 2017-08-27 NOTE — Telephone Encounter (Signed)
The patient called and stated that she needs to have a prescription discount card to help get her birth control that is no longer covered by her insurance. The patient would also like a call back to ask a few questions. Please advise.

## 2017-08-30 MED ORDER — NORETHIN-ETH ESTRAD-FE BIPHAS 1 MG-10 MCG / 10 MCG PO TABS: 1.0000 | ORAL_TABLET | Freq: Every day | ORAL | 1 refills | Status: DC

## 2017-08-30 NOTE — Telephone Encounter (Signed)
Pt called back and she was informed that a coupon card for Lo-loestrin would at the front desk for her to pick up. Pt also received refills on her LoLoestrin.

## 2017-08-30 NOTE — Telephone Encounter (Signed)
Pt called no answer LM via voicemail to call the office to speak more about her medication concerns.

## 2017-08-30 NOTE — Addendum Note (Signed)
Addended by: Edwyna Shell on: 08/30/2017 04:22 PM   Modules accepted: Orders

## 2017-10-07 ENCOUNTER — Encounter: Payer: Self-pay | Admitting: Obstetrics and Gynecology

## 2017-10-08 ENCOUNTER — Ambulatory Visit (INDEPENDENT_AMBULATORY_CARE_PROVIDER_SITE_OTHER): Payer: BLUE CROSS/BLUE SHIELD | Admitting: Obstetrics and Gynecology

## 2017-10-08 ENCOUNTER — Encounter: Payer: Self-pay | Admitting: Obstetrics and Gynecology

## 2017-10-08 ENCOUNTER — Other Ambulatory Visit (HOSPITAL_COMMUNITY)
Admission: RE | Admit: 2017-10-08 | Discharge: 2017-10-08 | Disposition: A | Discharge status: Home / Self Care | Payer: BLUE CROSS/BLUE SHIELD | Source: Ambulatory Visit | Attending: Obstetrics and Gynecology | Admitting: Obstetrics and Gynecology

## 2017-10-08 VITALS — BP 109/67 | HR 83 | Ht 68.0 in | Wt 138.6 lb

## 2017-10-08 DIAGNOSIS — Z01419 Encounter for gynecological examination (general) (routine) without abnormal findings: Secondary | ICD-10-CM | POA: Insufficient documentation

## 2017-10-08 DIAGNOSIS — Z124 Encounter for screening for malignant neoplasm of cervix: Secondary | ICD-10-CM | POA: Diagnosis not present

## 2017-10-08 DIAGNOSIS — Z308 Encounter for other contraceptive management: Secondary | ICD-10-CM

## 2017-10-08 DIAGNOSIS — N926 Irregular menstruation, unspecified: Secondary | ICD-10-CM | POA: Diagnosis not present

## 2017-10-08 NOTE — Patient Instructions (Signed)

## 2017-10-08 NOTE — Progress Notes (Signed)
GYNECOLOGY ANNUAL PHYSICAL EXAM PROGRESS NOTE  Subjective:    Monica Price is a 28 y.o. G0P0 female who presents for an annual exam.  The patient is sexually active. The patient wears seatbelts: yes. The patient participates in regular exercise: no. Has the patient ever been transfused or tattooed?: no. The patient reports that there is not domestic violence in her life.   The patient has the following complaints today:  1. Notes that she was unable to continue her contraception due to cost. Was previously prescribed Lo-Loestrin. Would like to restart something comparable. Was using birth control to regulate her cycles.   Gynecologic History Menarche age: 40 Patient's last menstrual period was 09/19/2017. Contraception: vasectomy History of STI's: Denies  Last Pap: 08/2014. Results were: normal.  Denies h/o abnormal pap smears.    OB History  Gravida Para Term Preterm AB Living  0 0 0 0 0 0  SAB TAB Ectopic Multiple Live Births  0 0 0 0 0    Past Medical History:  Diagnosis Date  . Microscopic hematuria   . Recurrent UTI    Pt has been treated for kidney infection and UTI for 10 months    Past Surgical History:  Procedure Laterality Date  . WISDOM TOOTH EXTRACTION      Family History  Problem Relation Age of Onset  . Healthy Mother   . Healthy Father   . Bladder Cancer Maternal Aunt        great  . Bladder Cancer Maternal Uncle        great  . Kidney Stones Maternal Grandmother   . Throat cancer Maternal Grandfather   . Kidney disease Neg Hx   . Prostate cancer Neg Hx   . Kidney cancer Neg Hx     Social History   Socioeconomic History  . Marital status: Single    Spouse name: Not on file  . Number of children: Not on file  . Years of education: Not on file  . Highest education level: Not on file  Occupational History  . Not on file  Social Needs  . Financial resource strain: Not on file  . Food insecurity:    Worry: Not on file    Inability: Not  on file  . Transportation needs:    Medical: Not on file    Non-medical: Not on file  Tobacco Use  . Smoking status: Never Smoker  . Smokeless tobacco: Never Used  Substance and Sexual Activity  . Alcohol use: Yes    Comment: occass  . Drug use: No  . Sexual activity: Yes    Birth control/protection: None    Comment: Partner has vasectomy   Lifestyle  . Physical activity:    Days per week: Not on file    Minutes per session: Not on file  . Stress: Not on file  Relationships  . Social connections:    Talks on phone: Not on file    Gets together: Not on file    Attends religious service: Not on file    Active member of club or organization: Not on file    Attends meetings of clubs or organizations: Not on file    Relationship status: Not on file  . Intimate partner violence:    Fear of current or ex partner: Not on file    Emotionally abused: Not on file    Physically abused: Not on file    Forced sexual activity: Not on file  Other  Topics Concern  . Not on file  Social History Narrative  . Not on file    Current Outpatient Medications on File Prior to Visit  Medication Sig Dispense Refill  . nitrofurantoin (MACRODANTIN) 100 MG capsule Take 1 capsule (100 mg total) by mouth at bedtime. 90 capsule 0   No current facility-administered medications on file prior to visit.     No Known Allergies   Review of Systems Constitutional: negative for chills, fatigue, fevers and sweats Eyes: negative for irritation, redness and visual disturbance Ears, nose, mouth, throat, and face: negative for hearing loss, nasal congestion, snoring and tinnitus Respiratory: negative for asthma, cough, sputum Cardiovascular: negative for chest pain, dyspnea, exertional chest pressure/discomfort, irregular heart beat, palpitations and syncope Gastrointestinal: negative for abdominal pain, change in bowel habits, nausea and vomiting Genitourinary:  Positive for irregular abnormal menstrual  periods. Negative for dysuria,  genital lesions, sexual problems and vaginal discharge, and urinary incontinence Integument/breast: negative for breast lump, breast tenderness and nipple discharge Hematologic/lymphatic: negative for bleeding and easy bruising Musculoskeletal:negative for back pain and muscle weakness Neurological: negative for dizziness, headaches, vertigo and weakness Endocrine: negative for diabetic symptoms including polydipsia, polyuria and skin dryness Allergic/Immunologic: negative for hay fever and urticaria     Objective:  Blood pressure 109/67, pulse 83, height 5\' 8"  (1.727 m), weight 138 lb 9.6 oz (62.9 kg), last menstrual period 09/19/2017. Body mass index is 21.07 kg/m.  General Appearance:    Alert, cooperative, no distress, appears stated age  Head:    Normocephalic, without obvious abnormality, atraumatic  Eyes:    PERRL, conjunctiva/corneas clear, EOM's intact, both eyes  Ears:    Normal external ear canals, both ears  Nose:   Nares normal, septum midline, mucosa normal, no drainage or sinus tenderness  Throat:   Lips, mucosa, and tongue normal; teeth and gums normal  Neck:   Supple, symmetrical, trachea midline, no adenopathy; thyroid: no enlargement/tenderness/nodules; no carotid bruit or JVD  Back:     Symmetric, no curvature, ROM normal, no CVA tenderness  Lungs:     Clear to auscultation bilaterally, respirations unlabored  Chest Wall:    No tenderness or deformity   Heart:    Regular rate and rhythm, S1 and S2 normal, no murmur, rub or gallop  Breast Exam:    No tenderness, masses, or nipple abnormality  Abdomen:     Soft, non-tender, bowel sounds active all four quadrants, no masses, no organomegaly.    Genitalia:    Pelvic:external genitalia normal, vagina without lesions, discharge, or tenderness, rectovaginal septum  normal. Cervix normal in appearance, no cervical motion tenderness, no adnexal masses or tenderness.  Uterus normal size, shape,  mobile, regular contours, nontender.  Rectal:    Normal external sphincter.  No hemorrhoids appreciated. Internal exam not done.   Extremities:   Extremities normal, atraumatic, no cyanosis or edema  Pulses:   2+ and symmetric all extremities  Skin:   Skin color, texture, turgor normal, no rashes or lesions  Lymph nodes:   Cervical, supraclavicular, and axillary nodes normal  Neurologic:   CNII-XII intact, normal strength, sensation and reflexes throughout   .  Labs:  Lab Results  Component Value Date   WBC 5.7 04/24/2016   HGB 13.7 04/24/2016   HCT 40.5 04/24/2016   MCV 91.9 04/24/2016   PLT 169 04/24/2016    Lab Results  Component Value Date   CREATININE 0.68 04/24/2016   BUN 10 04/24/2016   NA 137 04/24/2016  K 3.9 04/24/2016   CL 103 04/24/2016   CO2 29 04/24/2016    Lab Results  Component Value Date   ALT 20 04/24/2016   AST 26 04/24/2016   ALKPHOS 41 04/24/2016   BILITOT 0.6 04/24/2016    Lab Results  Component Value Date   TSH 0.977 04/01/2017     Assessment:    Routine gynecologic exam.   Contraception management  Irregular menses  Plan:    Blood tests: None performed.  Up to date.  Breast self exam technique reviewed and patient encouraged to perform self-exam monthly. Contraception: vasectomy.   Irregular menses, was using OCPs to regulate however unable to afford brand Lo-Loestrin which she prefers. Given samples, will attempt to have insurance cover.  Pap smear performed today.  Discussed healthy lifestyle modifications. Declines flu vaccine.  Follow up in 1 year.    Rubie Maid, MD [facility]

## 2017-10-08 NOTE — Progress Notes (Signed)
PT is present today for her annual exam. Pt stated that she has not been doing self-breast exams monthly. Pt stated that she is doing well and denies any issues. No problems or concerns.    

## 2017-10-09 LAB — CBC
Hematocrit: 32.9 % — ABNORMAL LOW (ref 34.0–46.6)
Hemoglobin: 9.9 g/dL — ABNORMAL LOW (ref 11.1–15.9)
MCH: 21.8 pg — AB (ref 26.6–33.0)
MCHC: 30.1 g/dL — AB (ref 31.5–35.7)
MCV: 73 fL — AB (ref 79–97)
Platelets: 228 10*3/uL (ref 150–450)
RBC: 4.54 x10E6/uL (ref 3.77–5.28)
RDW: 15.9 % — AB (ref 12.3–15.4)
WBC: 4.6 10*3/uL (ref 3.4–10.8)

## 2017-10-09 LAB — COMPREHENSIVE METABOLIC PANEL
A/G RATIO: 2 (ref 1.2–2.2)
ALK PHOS: 43 IU/L (ref 39–117)
ALT: 10 IU/L (ref 0–32)
AST: 18 IU/L (ref 0–40)
Albumin: 4.6 g/dL (ref 3.5–5.5)
BILIRUBIN TOTAL: 0.4 mg/dL (ref 0.0–1.2)
BUN/Creatinine Ratio: 16 (ref 9–23)
BUN: 13 mg/dL (ref 6–20)
CHLORIDE: 103 mmol/L (ref 96–106)
CO2: 24 mmol/L (ref 20–29)
Calcium: 9.2 mg/dL (ref 8.7–10.2)
Creatinine, Ser: 0.8 mg/dL (ref 0.57–1.00)
GFR calc non Af Amer: 102 mL/min/{1.73_m2} (ref >59)
GFR, EST AFRICAN AMERICAN: 118 mL/min/{1.73_m2} (ref >59)
Globulin, Total: 2.3 g/dL (ref 1.5–4.5)
Glucose: 79 mg/dL (ref 65–99)
POTASSIUM: 4.7 mmol/L (ref 3.5–5.2)
Sodium: 142 mmol/L (ref 134–144)
Total Protein: 6.9 g/dL (ref 6.0–8.5)

## 2017-10-11 ENCOUNTER — Other Ambulatory Visit: Payer: Self-pay

## 2017-10-11 LAB — CYTOLOGY - PAP: Diagnosis: NEGATIVE

## 2017-10-11 MED ORDER — IRON 325 (65 FE) MG PO TABS: 325.0000 mg | ORAL_TABLET | Freq: Every day | ORAL | 1 refills | Status: DC

## 2017-10-11 NOTE — Telephone Encounter (Signed)
Pt is aware of her test results. Iron supp were prescribed for pt and sent to her pharmacy.

## 2018-01-27 ENCOUNTER — Encounter: Payer: Self-pay | Admitting: *Deleted

## 2018-01-27 ENCOUNTER — Encounter
Admission: RE | Admit: 2018-01-27 | Discharge: 2018-01-27 | Disposition: A | Discharge status: Home / Self Care | Payer: Self-pay | Source: Ambulatory Visit | Attending: Urology | Admitting: Urology

## 2018-01-27 ENCOUNTER — Other Ambulatory Visit: Payer: Self-pay

## 2018-01-27 HISTORY — DX: Gastro-esophageal reflux disease without esophagitis: K21.9

## 2018-01-27 HISTORY — DX: Anemia, unspecified: D64.9

## 2018-01-27 NOTE — Patient Instructions (Signed)
Your procedure is scheduled on: 02-08-18 TUESDAY Report to Same Day Surgery 2nd floor medical mall [facility] Entrance-take elevator on left to 2nd floor.  Check in with surgery information desk.) To find out your arrival time please call [phone] between 1PM - 3PM on 02-07-18 MONDAY  Remember: Instructions that are not followed completely may result in serious medical risk, up to and including death, or upon the discretion of your surgeon and anesthesiologist your surgery may need to be rescheduled.    _x___ 1. Do not eat food after midnight the night before your procedure. NO GUM OR CANDY AFTER MIDNIGHT. You may drink clear liquids up to 2 hours before you are scheduled to arrive at the hospital for your procedure.  Do not drink clear liquids within 2 hours of your scheduled arrival to the hospital.  Clear liquids include  --Water or Apple juice without pulp  --Clear carbohydrate beverage such as ClearFast or Gatorade  --Black Coffee or Clear Tea (No milk, no creamers, do not add anything to the coffee or Tea   ____Ensure clear carbohydrate drink on the way to the hospital for bariatric patients  ____Ensure clear carbohydrate drink 3 hours before surgery for Dr Dwyane Luo patients if physician instructed.     __x__ 2. No Alcohol for 24 hours before or after surgery.   __x__3. No Smoking or e-cigarettes for 24 prior to surgery.  Do not use any chewable tobacco products for at least 6 hour prior to surgery   ____  4. Bring all medications with you on the day of surgery if instructed.    __x__ 5. Notify your doctor if there is any change in your medical condition     (cold, fever, infections).    x___6. On the morning of surgery brush your teeth with toothpaste and water.  You may rinse your mouth with mouth wash if you wish.  Do not swallow any toothpaste or mouthwash.   Do not wear jewelry, make-up, hairpins, clips or nail polish.  Do not wear lotions, powders, or perfumes. You  may wear deodorant.  Do not shave 48 hours prior to surgery. Men may shave face and neck.  Do not bring valuables to the hospital.    [facility] is not responsible for any belongings or valuables.               Contacts, dentures or bridgework may not be worn into surgery.  Leave your suitcase in the car. After surgery it may be brought to your room.  For patients admitted to the hospital, discharge time is determined by your treatment team.  _  Patients discharged the day of surgery will not be allowed to drive home.  You will need someone to drive you home and stay with you the night of your procedure.    Please read over the following fact sheets that you were given:   [facility] Preparing for Surgery   ____ Take anti-hypertensive listed below, cardiac, seizure, asthma, anti-reflux and psychiatric medicines. These include:  1. NONE  2.  3.  4.  5.  6.  ____Fleets enema or Magnesium Citrate as directed.   ____ Use CHG Soap or sage wipes as directed on instruction sheet   ____ Use inhalers on the day of surgery and bring to hospital day of surgery  ____ Stop Metformin and Janumet 2 days prior to surgery.    ____ Take 1/2 of usual insulin dose the night before surgery and none on  the morning     surgery.   ____ Follow recommendations from Cardiologist, Pulmonologist or PCP regarding          stopping Aspirin, Coumadin, Plavix ,Eliquis, Effient, or Pradaxa, and Pletal.  X____Stop Anti-inflammatories such as Advil, Aleve, Ibuprofen, Motrin, Naproxen, Naprosyn, Goodies powders or aspirin products NOW-OK to take Tylenol    ____ Stop supplements until after surgery.    ____ Bring C-Pap to the hospital.

## 2018-01-31 ENCOUNTER — Encounter: Payer: Self-pay | Admitting: Obstetrics and Gynecology

## 2018-01-31 ENCOUNTER — Encounter
Admission: RE | Admit: 2018-01-31 | Discharge: 2018-01-31 | Disposition: A | Discharge status: Home / Self Care | Payer: PRIVATE HEALTH INSURANCE | Source: Ambulatory Visit | Attending: Urology | Admitting: Urology

## 2018-01-31 DIAGNOSIS — Z01812 Encounter for preprocedural laboratory examination: Secondary | ICD-10-CM | POA: Insufficient documentation

## 2018-01-31 LAB — HEMOGLOBIN: Hemoglobin: 13 g/dL (ref 12.0–15.0)

## 2018-02-08 ENCOUNTER — Encounter: Payer: Self-pay | Admitting: Anesthesiology

## 2018-02-08 ENCOUNTER — Encounter: Payer: Self-pay | Admitting: Emergency Medicine

## 2018-02-08 ENCOUNTER — Other Ambulatory Visit: Payer: Self-pay

## 2018-02-08 ENCOUNTER — Emergency Department
Admission: EM | Admit: 2018-02-08 | Discharge: 2018-02-08 | Disposition: A | Discharge status: Home / Self Care | Payer: PRIVATE HEALTH INSURANCE | Source: Home / Self Care | Attending: Emergency Medicine | Admitting: Emergency Medicine

## 2018-02-08 ENCOUNTER — Ambulatory Visit: Payer: PRIVATE HEALTH INSURANCE | Admitting: Anesthesiology

## 2018-02-08 ENCOUNTER — Encounter
Admission: RE | Disposition: A | Discharge status: Home / Self Care | Payer: Self-pay | Source: Home / Self Care | Attending: Urology

## 2018-02-08 ENCOUNTER — Ambulatory Visit
Admission: RE | Admit: 2018-02-08 | Discharge: 2018-02-08 | Disposition: A | Discharge status: Home / Self Care | Payer: PRIVATE HEALTH INSURANCE | Attending: Urology | Admitting: Urology

## 2018-02-08 DIAGNOSIS — R339 Retention of urine, unspecified: Secondary | ICD-10-CM

## 2018-02-08 DIAGNOSIS — N3010 Interstitial cystitis (chronic) without hematuria: Secondary | ICD-10-CM | POA: Diagnosis not present

## 2018-02-08 DIAGNOSIS — R3989 Other symptoms and signs involving the genitourinary system: Secondary | ICD-10-CM | POA: Diagnosis present

## 2018-02-08 DIAGNOSIS — Z01812 Encounter for preprocedural laboratory examination: Secondary | ICD-10-CM | POA: Diagnosis not present

## 2018-02-08 HISTORY — PX: CYSTO WITH HYDRODISTENSION: SHX5453

## 2018-02-08 LAB — URINALYSIS, COMPLETE (UACMP) WITH MICROSCOPIC
Bilirubin Urine: NEGATIVE
Glucose, UA: NEGATIVE mg/dL
Ketones, ur: NEGATIVE mg/dL
Nitrite: NEGATIVE
Protein, ur: 30 mg/dL — AB
Specific Gravity, Urine: 1.006 (ref 1.005–1.030)
pH: 7 (ref 5.0–8.0)

## 2018-02-08 SURGERY — CYSTOSCOPY, WITH BLADDER HYDRODISTENSION
Anesthesia: General

## 2018-02-08 MED ORDER — MIDAZOLAM HCL 5 MG/5ML IJ SOLN
INTRAMUSCULAR | Status: DC | PRN
  Administered 2018-02-08: 2 mg via INTRAVENOUS

## 2018-02-08 MED ORDER — ONDANSETRON HCL 4 MG/2ML IJ SOLN: 4.0000 mg | Freq: Once | INTRAMUSCULAR | Status: DC | PRN

## 2018-02-08 MED ORDER — PENTOSAN POLYSULFATE SODIUM 100 MG PO CAPS: 100.0000 mg | ORAL_CAPSULE | Freq: Three times a day (TID) | ORAL | 3 refills | Status: DC

## 2018-02-08 MED ORDER — LACTATED RINGERS IV SOLN
INTRAVENOUS | Status: DC
  Administered 2018-02-08: 14:00:00 via INTRAVENOUS

## 2018-02-08 MED ORDER — CEFAZOLIN SODIUM-DEXTROSE 1-4 GM/50ML-% IV SOLN
1.0000 g | Freq: Once | INTRAVENOUS | Status: AC
  Administered 2018-02-08: 1 g via INTRAVENOUS

## 2018-02-08 MED ORDER — BELLADONNA ALKALOIDS-OPIUM 16.2-60 MG RE SUPP
RECTAL | Status: DC | PRN
  Administered 2018-02-08: 1 via RECTAL

## 2018-02-08 MED ORDER — HYDROXYZINE PAMOATE 25 MG PO CAPS: 25.0000 mg | ORAL_CAPSULE | Freq: Every day | ORAL | 3 refills | Status: DC

## 2018-02-08 MED ORDER — ONDANSETRON HCL 4 MG/2ML IJ SOLN
INTRAMUSCULAR | Status: DC | PRN
  Administered 2018-02-08: 4 mg via INTRAVENOUS

## 2018-02-08 MED ORDER — FAMOTIDINE 20 MG PO TABS
ORAL_TABLET | ORAL | Status: AC
  Filled 2018-02-08: qty 1

## 2018-02-08 MED ORDER — FENTANYL CITRATE (PF) 100 MCG/2ML IJ SOLN
INTRAMUSCULAR | Status: AC
  Filled 2018-02-08: qty 2

## 2018-02-08 MED ORDER — LIDOCAINE HCL (CARDIAC) PF 100 MG/5ML IV SOSY
PREFILLED_SYRINGE | INTRAVENOUS | Status: DC | PRN
  Administered 2018-02-08: 60 mg via INTRAVENOUS

## 2018-02-08 MED ORDER — PROPOFOL 10 MG/ML IV BOLUS
INTRAVENOUS | Status: DC | PRN
  Administered 2018-02-08: 30 mg via INTRAVENOUS
  Administered 2018-02-08: 170 mg via INTRAVENOUS

## 2018-02-08 MED ORDER — FENTANYL CITRATE (PF) 100 MCG/2ML IJ SOLN
25.0000 ug | INTRAMUSCULAR | Status: DC | PRN
  Administered 2018-02-08 (×4): 25 ug via INTRAVENOUS

## 2018-02-08 MED ORDER — FAMOTIDINE 20 MG PO TABS
20.0000 mg | ORAL_TABLET | Freq: Once | ORAL | Status: AC
  Administered 2018-02-08: 20 mg via ORAL

## 2018-02-08 MED ORDER — GLYCOPYRROLATE 0.2 MG/ML IJ SOLN
INTRAMUSCULAR | Status: DC | PRN
  Administered 2018-02-08: 0.1 mg via INTRAVENOUS

## 2018-02-08 MED ORDER — KETOROLAC TROMETHAMINE 30 MG/ML IJ SOLN
INTRAMUSCULAR | Status: DC | PRN
  Administered 2018-02-08: 30 mg via INTRAVENOUS

## 2018-02-08 MED ORDER — CEFAZOLIN SODIUM-DEXTROSE 1-4 GM/50ML-% IV SOLN
INTRAVENOUS | Status: AC
  Filled 2018-02-08: qty 50

## 2018-02-08 MED ORDER — BELLADONNA ALKALOIDS-OPIUM 16.2-60 MG RE SUPP
RECTAL | Status: AC
  Filled 2018-02-08: qty 1

## 2018-02-08 MED ORDER — LIDOCAINE HCL URETHRAL/MUCOSAL 2 % EX GEL
CUTANEOUS | Status: DC | PRN
  Administered 2018-02-08: 1

## 2018-02-08 MED ORDER — DEXAMETHASONE SODIUM PHOSPHATE 10 MG/ML IJ SOLN
INTRAMUSCULAR | Status: DC | PRN
  Administered 2018-02-08: 5 mg via INTRAVENOUS

## 2018-02-08 MED ORDER — FENTANYL CITRATE (PF) 100 MCG/2ML IJ SOLN
INTRAMUSCULAR | Status: DC | PRN
  Administered 2018-02-08: 50 ug via INTRAVENOUS

## 2018-02-08 SURGICAL SUPPLY — 14 items
BAG DRAIN CYSTO-URO LG1000N (MISCELLANEOUS) ×2 IMPLANT
BRUSH SCRUB EZ  4% CHG (MISCELLANEOUS) ×1
BRUSH SCRUB EZ 4% CHG (MISCELLANEOUS) ×1 IMPLANT
COVER WAND RF STERILE (DRAPES) ×2 IMPLANT
GLOVE BIO SURGEON STRL SZ7 (GLOVE) ×4 IMPLANT
GLOVE BIO SURGEON STRL SZ7.5 (GLOVE) ×2 IMPLANT
GOWN STRL REUS W/ TWL LRG LVL3 (GOWN DISPOSABLE) ×1 IMPLANT
GOWN STRL REUS W/ TWL XL LVL3 (GOWN DISPOSABLE) ×1 IMPLANT
GOWN STRL REUS W/TWL LRG LVL3 (GOWN DISPOSABLE) ×1
GOWN STRL REUS W/TWL XL LVL3 (GOWN DISPOSABLE) ×1
PACK CYSTO AR (MISCELLANEOUS) ×2 IMPLANT
SET CYSTO W/LG BORE CLAMP LF (SET/KITS/TRAYS/PACK) ×2 IMPLANT
WATER STERILE IRR 1000ML POUR (IV SOLUTION) ×2 IMPLANT
WATER STERILE IRR 3000ML UROMA (IV SOLUTION) ×2 IMPLANT

## 2018-02-08 NOTE — H&P (Signed)
NAME: Monica Price, TWIFORD MEDICAL RECORD [id number] ACCOUNT [id number] DATE OF BIRTH:Apr 19, 1990 FACILITY: [facility] LOCATION: [facility] PHYSICIAN:MICHAEL Farrel Conners, MD  HISTORY AND PHYSICAL  DATE OF ADMISSION:  02/08/2018  CHIEF COMPLAINT:  Painful bladder syndrome.  HISTORY OF PRESENT ILLNESS:  The patient is a 28 year old Caucasian female with a 2-1/2 year history of intermittent bladder pain associated with dysuria.  She may have had several culture documented UTIs, but most of the time, HER cultures are negative.   She associates the onset of her symptoms with her menstrual cycle and occasionally after sexual activity.  She has not had hematuria.  She had a CT scan in 2018, which was unremarkable.  She had cystoscopy in April 2019 and was told she had  interstitial cystitis.  She had used chronic daily nitrofurantoin for 2 years without improvement.  She had a potassium sensitivity test performed 12/31/2017 which was suggestive of interstitial cystitis.  She comes in now for cystoscopy with  hydrodilatation.  ALLERGIES:  No drug allergies.  CURRENT MEDICATIONS:  No medications.  PAST SURGICAL HISTORY:  Removal of wisdom teeth 2014, cystoscopy 2019.  PAST AND CURRENT MEDICAL CONDITIONS: 1.  History of recurrent cystitis. 2.  Possible interstitial cystitis.  REVIEW OF SYSTEMS:  The patient may have had anemia in the past.   She also has tension headaches.  She denies chest pain, shortness of breath, diabetes, stroke or hypertension.  SOCIAL HISTORY:  The patient denied tobacco use.  She consumes 3 alcoholic beverages per week.  FAMILY HISTORY:  Negative for urological disease.  The patient has a maternal grandmother, age 44 with kidney stones.  PHYSICAL EXAMINATION: VITAL SIGNS:  Height 5 feet 8, weight 140. GENERAL:  A well-nourished, white female in no acute distress. HEENT:  Sclerae were clear.  Pupils are equally round, reactive to light and accommodation.  Extraocular  movements are intact. NECK:  No palpable masses or tenderness.  Thyroid gland was smooth without palpable nodules.  No audible carotid bruits. PULMONARY:  Lungs were clear to auscultation. CARDIOVASCULAR:  Regular rhythm and rate without audible murmurs. ABDOMEN:  Soft, nontender abdomen.  No CVA tenderness. NEUROMUSCULAR:  Alert and oriented x3.  IMPRESSION:  Painful bladder syndrome.  PLAN:  Cystoscopy with hydrodilatation.  AN/NUANCE  D:02/07/2018 T:02/07/2018 JOB:[id number]

## 2018-02-08 NOTE — ED Notes (Signed)
Pt bladder scanned by MD Alfred Levins and pt has 574 mL in bladder.

## 2018-02-08 NOTE — ED Provider Notes (Signed)
[facility] Emergency Department Provider Note  ____________________________________________  Time seen: Approximately 10:20 PM  I have reviewed the triage vital signs and the nursing notes.   HISTORY  Chief Complaint Urinary Retention   HPI Monica Price is a 28 y.o. female history of chronic cystitis and recurrent UTIs who presents for evaluation of urinary retention.  Patient underwent cystoscopy today under the sedation of propofol.  She was left from postop area before she urinated.  Since she went home patient has been unable to urinate.  She is complaining of severe sharp suprapubic abdominal pain.  No fever or chills, no dysuria.  Past Medical History:  Diagnosis Date  . Anemia   . GERD (gastroesophageal reflux disease)   . Microscopic hematuria   . Recurrent UTI    Pt has been treated for kidney infection and UTI for 10 months    Patient Active Problem List   Diagnosis Date Noted  . Encounter for prescription of oral contraceptives 08/19/2017  . Interstitial cystitis 08/19/2017    Past Surgical History:  Procedure Laterality Date  . WISDOM TOOTH EXTRACTION      Prior to Admission medications   Medication Sig Start Date End Date Taking? Authorizing Provider  Ferrous Sulfate (IRON) 325 (65 Fe) MG TABS Take 1 tablet (325 mg total) by mouth daily. 10/11/17   Rubie Maid, MD  hydrOXYzine (VISTARIL) 25 MG capsule Take 1 capsule (25 mg total) by mouth at bedtime. 02/08/18   Royston Cowper, MD  Meth-Hyo-M Barnett Hatter Phos-Ph Sal (URIBEL) 118 MG CAPS Take 1 capsule by mouth as needed.    [provider]  mirabegron ER (MYRBETRIQ) 50 MG TB24 tablet Take 50 mg by mouth as needed.     [provider]  nitrofurantoin (MACRODANTIN) 100 MG capsule Take 1 capsule (100 mg total) by mouth at bedtime. 08/19/17   Pleas Koch, NP  nitrofurantoin (MACRODANTIN) 100 MG capsule Take 100 mg by mouth as needed.     [provider]  pentosan polysulfate (ELMIRON) 100 MG capsule Take 1 capsule (100 mg total) by mouth 3 (three) times daily. 02/08/18   Royston Cowper, MD    Allergies Patient has no known allergies.  Family History  Problem Relation Age of Onset  . Healthy Mother   . Healthy Father   . Bladder Cancer Maternal Aunt        great  . Bladder Cancer Maternal Uncle        great  . Kidney Stones Maternal Grandmother   . Throat cancer Maternal Grandfather   . Kidney disease Neg Hx   . Prostate cancer Neg Hx   . Kidney cancer Neg Hx     Social History Social History   Tobacco Use  . Smoking status: Never Smoker  . Smokeless tobacco: Never Used  Substance Use Topics  . Alcohol use: Yes    Frequency: Never    Comment: RARE  . Drug use: Never    Review of Systems  Constitutional: Negative for fever. Eyes: Negative for visual changes. ENT: Negative for sore throat. Neck: No neck pain  Cardiovascular: Negative for chest pain. Respiratory: Negative for shortness of breath. Gastrointestinal: Negative for abdominal pain, vomiting or diarrhea. Genitourinary: Negative for dysuria. + urinary retention Musculoskeletal: Negative for back pain. Skin: Negative for rash. Neurological: Negative for headaches, weakness or numbness. Psych: No SI or HI  ____________________________________________   PHYSICAL EXAM:  VITAL SIGNS: ED Triage Vitals  Enc Vitals  Group     BP 02/08/18 2101 125/84     Pulse Rate 02/08/18 2101 (!) 101     Resp 02/08/18 2101 16     Temp 02/08/18 2101 98.2 F (36.8 C)     Temp Source 02/08/18 2101 Oral     SpO2 02/08/18 2101 98 %     Weight 02/08/18 2102 142 lb 3.2 oz (64.5 kg)     Height 02/08/18 2102 5\' 8"  (1.727 m)     Head Circumference --      Peak Flow --      Pain Score 02/08/18 2102 8     Pain Loc --      Pain Edu? --      Excl. in [place]? --     Constitutional: Alert and oriented. Well appearing and in no apparent distress. HEENT:      Head:  Normocephalic and atraumatic.         Eyes: Conjunctivae are normal. Sclera is non-icteric.       Mouth/Throat: Mucous membranes are moist.       Neck: Supple with no signs of meningismus. Cardiovascular: Regular rate and rhythm. No murmurs, gallops, or rubs. 2+ symmetrical distal pulses are present in all extremities. No JVD. Respiratory: Normal respiratory effort. Lungs are clear to auscultation bilaterally. No wheezes, crackles, or rhonchi.  Gastrointestinal: Soft, distended bladder, and non distended with positive bowel sounds. No rebound or guarding. Musculoskeletal: Nontender with normal range of motion in all extremities. No edema, cyanosis, or erythema of extremities. Neurologic: Normal speech and language. Face is symmetric. Moving all extremities. No gross focal neurologic deficits are appreciated. Skin: Skin is warm, dry and intact. No rash noted. Psychiatric: Mood and affect are normal. Speech and behavior are normal.  ____________________________________________   LABS (all labs ordered are listed, but only abnormal results are displayed)  Labs Reviewed  URINALYSIS, COMPLETE (UACMP) WITH MICROSCOPIC - Abnormal; Notable for the following components:      Result Value   Color, Urine YELLOW (*)    APPearance CLEAR (*)    Hgb urine dipstick LARGE (*)    Protein, ur 30 (*)    Leukocytes, UA SMALL (*)    Bacteria, UA RARE (*)    All other components within normal limits  URINE CULTURE   ____________________________________________  EKG  none  ____________________________________________  RADIOLOGY  none  ____________________________________________   PROCEDURES  Procedure(s) performed: None Procedures Critical Care performed:  None ____________________________________________   INITIAL IMPRESSION / ASSESSMENT AND PLAN / ED COURSE  28 y.o. female history of chronic cystitis and recurrent UTIs who presents for evaluation of urinary retention after undergoing  cystoscopy this morning under the sedation of propofol.  Patient with 1100 cc after Foley placement and full resolution of her symptoms.  She does have some inflammation and bacteria in her urine however underwent the procedure today therefore we will hold off treatment.  Urine has been sent for culture.  Patient was given a leg bag for her Foley catheter.  Foley instructions discussed with her.  Recommended follow-up with her urologist in 48 hours for trial of void.  Discussed and return precautions.      As part of my medical decision making, I reviewed the following data within the [facility] notes reviewed and incorporated, Labs reviewed , Old chart reviewed, Notes from prior ED visits and [place] Controlled Substance Database    Pertinent labs & imaging results that were available during my care of  the patient were reviewed by me and considered in my medical decision making (see chart for details).    ____________________________________________   FINAL CLINICAL IMPRESSION(S) / ED DIAGNOSES  Final diagnoses:  Urinary retention      NEW MEDICATIONS STARTED DURING THIS VISIT:  ED Discharge Orders    None       Note:  This document was prepared using Dragon voice recognition software and may include unintentional dictation errors.    Rudene Re, MD 02/08/18 2223

## 2018-02-08 NOTE — Anesthesia Postprocedure Evaluation (Signed)
Anesthesia Post Note  Patient: Monica Price  Procedure(s) Performed: CYSTOSCOPY/HYDRODISTENSION (N/A )  Patient location during evaluation: PACU Anesthesia Type: General Level of consciousness: awake and alert Pain management: pain level controlled Vital Signs Assessment: post-procedure vital signs reviewed and stable Respiratory status: spontaneous breathing, nonlabored ventilation, respiratory function stable and patient connected to nasal cannula oxygen Cardiovascular status: blood pressure returned to baseline and stable Postop Assessment: no apparent nausea or vomiting Anesthetic complications: no     Last Vitals:  Vitals:   02/08/18 1642 02/08/18 1710  BP: 108/71 108/80  Pulse: 69 69  Resp: 16 16  Temp: (!) 36.3 C   SpO2: 100% 100%    Last Pain:  Vitals:   02/08/18 1642  TempSrc: Temporal  PainSc: [place]

## 2018-02-08 NOTE — Transfer of Care (Signed)
Immediate Anesthesia Transfer of Care Note  Patient: Monica Price  Procedure(s) Performed: CYSTOSCOPY/HYDRODISTENSION (N/A )  Patient Location: PACU  Anesthesia Type:General  Level of Consciousness: sedated  Airway & Oxygen Therapy: Patient Spontanous Breathing and Patient connected to face mask oxygen  Post-op Assessment: Report given to RN and Post -op Vital signs reviewed and stable  Post vital signs: Reviewed and stable  Last Vitals:  Vitals Value Taken Time  BP 107/68 02/08/2018  3:09 PM  Temp 36.7 C 02/08/2018  3:09 PM  Pulse 72 02/08/2018  3:10 PM  Resp 14 02/08/2018  3:10 PM  SpO2 100 % 02/08/2018  3:10 PM  Vitals shown include unvalidated device data.  Last Pain:  Vitals:   02/08/18 1321  TempSrc: Tympanic  PainSc: 2          Complications: No apparent anesthesia complications

## 2018-02-08 NOTE — Anesthesia Procedure Notes (Signed)
Procedure Name: LMA Insertion Date/Time: 02/08/2018 2:37 PM Performed by: Dionne Bucy, CRNA Pre-anesthesia Checklist: Patient identified, Patient being monitored, Timeout performed, Emergency Drugs available and Suction available Patient Re-evaluated:Patient Re-evaluated prior to induction Oxygen Delivery Method: Circle system utilized Preoxygenation: Pre-oxygenation with 100% oxygen Induction Type: IV induction Ventilation: Mask ventilation without difficulty LMA: LMA inserted LMA Size: 4.0 Tube type: Oral Number of attempts: 1 Placement Confirmation: positive ETCO2 and breath sounds checked- equal and bilateral Tube secured with: Tape Dental Injury: Teeth and Oropharynx as per pre-operative assessment

## 2018-02-08 NOTE — Discharge Instructions (Addendum)

## 2018-02-08 NOTE — ED Triage Notes (Signed)
Pt arrived to the ED accompanied by her husband for complaints of urinary retention secondary to having a medical procedure done today. Pt reports that she did not void in the office before leaving and has not been able to since. Pt is AOx4 in moderate pain.

## 2018-02-08 NOTE — Progress Notes (Signed)
PO ginger ale given per patient request.

## 2018-02-08 NOTE — Op Note (Signed)
Preoperative diagnosis: Painful bladder syndrome  Postoperative diagnosis: Interstitial cystitis  Procedure: Cystoscopy with hydrodilatation  Surgeon: Otelia Limes. Yves Dill MD  Anesthesia: General  Indications:See the history and physical. After informed consent the above procedure(s) were requested     Technique and findings: After adequate general anesthesia been obtained the patient was placed into dorsal lithotomy position and the perineum was prepped and draped in the usual fashion.  The 10 French the scope was coupled the camera and then advanced into the bladder.  The bladder was thoroughly inspected.  No bladder tumors or ulcers were seen.  Both ureteral orifices were identified and had clear efflux.  At this point the bladder was distended to 70 cm of water pressure.  Bladder was drained and glomerulations identified in all 4 bladder quadrants.  Bladder capacity was approximately 750 cc.  Hydrodistention was repeated and capacity of 800 cc obtained.  After the bladder was fully drained approximately 10 cc of viscous Xylocaine was instilled within the urethra and the bladder.  A B&O suppository was placed.  Procedure was then terminated and patient transferred to the recovery room in stable condition.

## 2018-02-08 NOTE — H&P (Signed)
Date of Initial H&P: 02/07/18  History reviewed, patient examined, no change in status, stable for surgery.

## 2018-02-08 NOTE — Anesthesia Post-op Follow-up Note (Signed)
Anesthesia QCDR form completed.        

## 2018-02-08 NOTE — Anesthesia Preprocedure Evaluation (Signed)
Anesthesia Evaluation  Patient identified by MRN, date of birth, ID band Patient awake    Reviewed: Allergy & Precautions, NPO status , Patient's Chart, lab work & pertinent test results, reviewed documented beta blocker date and time   Airway Mallampati: II  TM Distance: >3 FB     Dental  (+) Chipped   Pulmonary           Cardiovascular      Neuro/Psych    GI/Hepatic GERD  Controlled,  Endo/Other    Renal/GU      Musculoskeletal   Abdominal   Peds  Hematology  (+) anemia ,   Anesthesia Other Findings   Reproductive/Obstetrics                             Anesthesia Physical Anesthesia Plan  ASA: II  Anesthesia Plan: General   Post-op Pain Management:    Induction: Intravenous  PONV Risk Score and Plan:   Airway Management Planned: LMA  Additional Equipment:   Intra-op Plan:   Post-operative Plan:   Informed Consent: I have reviewed the patients History and Physical, chart, labs and discussed the procedure including the risks, benefits and alternatives for the proposed anesthesia with the patient or authorized representative who has indicated his/her understanding and acceptance.       Plan Discussed with: CRNA  Anesthesia Plan Comments:         Anesthesia Quick Evaluation

## 2018-02-08 NOTE — Discharge Instructions (Addendum)
Hydroxyzine capsules or tablets What is this medicine? HYDROXYZINE (hye [place] i zeen) is an antihistamine. This medicine is used to treat allergy symptoms. It is also used to treat anxiety and tension. This medicine can be used with other medicines to induce sleep before surgery. This medicine may be used for other purposes; ask your health care provider or pharmacist if you have questions. COMMON BRAND NAME(S): ANX, Atarax, Rezine, Vistaril What should I tell my health care provider before I take this medicine? They need to know if you have any of these conditions: -glaucoma -heart disease -history of irregular heartbeat -kidney disease -liver disease -lung or breathing disease, like asthma -stomach or intestine problems -thyroid disease -trouble passing urine -an unusual or allergic reaction to hydroxyzine, cetirizine, other medicines, foods, dyes or preservatives -pregnant or trying to get pregnant -breast-feeding How should I use this medicine? Take this medicine by mouth with a full glass of water. Follow the directions on the prescription label. You may take this medicine with food or on an empty stomach. Take your medicine at regular intervals. Do not take your medicine more often than directed. Talk to your pediatrician regarding the use of this medicine in children. Special care may be needed. While this drug may be prescribed for children as young as 55 years of age for selected conditions, precautions do apply. Patients over 92 years old may have a stronger reaction and need a smaller dose. Overdosage: If you think you have taken too much of this medicine contact a poison control center or emergency room at once. NOTE: This medicine is only for you. Do not share this medicine with others. What if I miss a dose? If you miss a dose, take it as soon as you can. If it is almost time for your next dose, take only that dose. Do not take double or extra doses. What may interact with this  medicine? Do not take this medicine with any of the following medications: -cisapride -dofetilide -dronedarone -pimozide -thioridazine This medicine may also interact with the following medications: -alcohol -antihistamines for allergy, cough, and cold -atropine -barbiturate medicines for sleep or seizures, like phenobarbital -certain antibiotics like erythromycin or clarithromycin -certain medicines for anxiety or sleep -certain medicines for bladder problems like oxybutynin, tolterodine -certain medicines for depression or psychotic disturbances -certain medicines for irregular heart beat -certain medicines for Parkinson's disease like benztropine, trihexyphenidyl -certain medicines for seizures like phenobarbital, primidone -certain medicines for stomach problems like dicyclomine, hyoscyamine -certain medicines for travel sickness like scopolamine -ipratropium -narcotic medicines for pain -other medicines that prolong the QT interval (an abnormal heart rhythm) This list may not describe all possible interactions. Give your health care provider a list of all the medicines, herbs, non-prescription drugs, or dietary supplements you use. Also tell them if you smoke, drink alcohol, or use illegal drugs. Some items may interact with your medicine. What should I watch for while using this medicine? Tell your doctor or health care professional if your symptoms do not improve. You may get drowsy or dizzy. Do not drive, use machinery, or do anything that needs mental alertness until you know how this medicine affects you. Do not stand or sit up quickly, especially if you are an older patient. This reduces the risk of dizzy or fainting spells. Alcohol may interfere with the effect of this medicine. Avoid alcoholic drinks. Your mouth may get dry. Chewing sugarless gum or sucking hard candy, and drinking plenty of water may help. Contact your doctor if  the problem does not go away or is  severe. This medicine may cause dry eyes and blurred vision. If you wear contact lenses you may feel some discomfort. Lubricating drops may help. See your eye doctor if the problem does not go away or is severe. If you are receiving skin tests for allergies, tell your doctor you are using this medicine. What side effects may I notice from receiving this medicine? Side effects that you should report to your doctor or health care professional as soon as possible: -allergic reactions like skin rash, itching or hives, swelling of the face, lips, or tongue -changes in vision -confusion -fast, irregular heartbeat -seizures -tremor -trouble passing urine or change in the amount of urine Side effects that usually do not require medical attention (report to your doctor or health care professional if they continue or are bothersome): -constipation -drowsiness -dry mouth -headache -tiredness This list may not describe all possible side effects. Call your doctor for medical advice about side effects. You may report side effects to FDA at 1-800-FDA-1088. Where should I keep my medicine? Keep out of the reach of children. Store at room temperature between 15 and 30 degrees C (59 and 86 degrees F). Keep container tightly closed. Throw away any unused medicine after the expiration date. NOTE: This sheet is a summary. It may not cover all possible information. If you have questions about this medicine, talk to your doctor, pharmacist, or health care provider.  2019 Elsevier/Gold Standard (2017-07-12 13:25:13) Pentosan capsules What is this medicine? PENTOSAN (PEN [place]) is used to treat the bladder pain or discomfort caused by interstitial cystitis. This is a condition that causes inflammation of parts of the kidney. This medicine may be used for other purposes; ask your health care provider or pharmacist if you have questions. COMMON BRAND NAME(S): [place] What should I tell my health care provider  before I take this medicine? They need to know if you have any of these conditions: -kidney or liver disease -an unusual or allergic reaction to pentosan, other medicines, foods, dyes, or preservatives -pregnant or trying to get pregnant -breast-feeding How should I use this medicine? Take this medicine by mouth with a glass of water. Follow the directions on the prescription label. Take this medicine on an empty stomach, at least 1 hour before or 2 hours after food. Do not take with food. Take your doses at regular intervals. Do not take your medicine more often than directed. Talk to your pediatrician regarding the use of this medicine in children. Special care may be needed. Overdosage: If you think you have taken too much of this medicine contact a poison control center or emergency room at once. NOTE: This medicine is only for you. Do not share this medicine with others. What if I miss a dose? If you miss a dose, take it as soon as you can. If it is almost time for your next dose, take only that dose. Do not take double or extra doses. What may interact with this medicine? Do not take this medicine with any of the following medications: -agents that prevent or dissolve blood clots like heparin or warfarin -aspirin, especially in higher doses -mifepristone This medicine may also interact with the following medications: -clopidogrel -dipyridamole -NSAIDs, medicines for pain and inflammation, like ibuprofen or naproxen -ticlopidine This list may not describe all possible interactions. Give your health care provider a list of all the medicines, herbs, non-prescription drugs, or dietary supplements you use. Also tell them  if you smoke, drink alcohol, or use illegal drugs. Some items may interact with your medicine. What should I watch for while using this medicine? Visit your healthcare professional for regular checks on your progress. You may need blood work done while you are taking this  medicine. Your condition will be monitored carefully while you are receiving this medicine. It is important not to miss any appointments. Avoid sports and activities that might cause injury while you are using this medicine. Severe falls or injuries can cause unseen bleeding. Be careful when using sharp tools or knives. Consider using an Copy. Take special care brushing or flossing your teeth. Report any injuries, bruising, or red spots on the skin to your healthcare professional. If you are going to need surgery or other procedure, tell your healthcare professional that you are using this medicine. What side effects may I notice from receiving this medicine? Side effects that you should report to your doctor or health care professional as soon as possible: -allergic reactions like skin rash, itching or hives, swelling of the face, lips, or tongue -diarrhea -signs and symptoms of bleeding such as bloody or black, tarry stools; red or dark-brown urine; spitting up blood or brown material that looks like coffee grounds; red spots on the skin; unusual bruising or bleeding from the eye, gums, or nose -signs and symptoms of a blood clot such as chest pain; shortness of breath; pain, swelling, or warmth in the leg -signs and symptoms of a stroke such as changes in vision; confusion; trouble speaking or understanding; severe headaches; sudden numbness or weakness of the face, arm or leg; trouble walking; dizziness; loss of coordination -stomach pain Side effects that usually do not require medical attention (report to your doctor or health care professional if they continue or are bothersome): -hair loss -headache -nausea This list may not describe all possible side effects. Call your doctor for medical advice about side effects. You may report side effects to FDA at 1-800-FDA-1088. Where should I keep my medicine? Keep out of the reach of children. Store at room temperature between 15 and 30  degrees C (59 and 86 degrees F). Throw away any unused medicine after the expiration date. NOTE: This sheet is a summary. It may not cover all possible information. If you have questions about this medicine, talk to your doctor, pharmacist, or health care provider.  2019 Elsevier/Gold Standard (2016-10-15 11:06:10) Eating Plan for Interstitial Cystitis Interstitial cystitis (IC) is a long-term (chronic) condition that causes pain and pressure in the bladder, the lower abdomen, and the pelvic area. Other symptoms of IC include urinary urgency and frequency. Symptoms tend to come and go. Many people with IC find that certain foods trigger their symptoms. Different foods may be problematic for different people. Some foods are more likely to cause symptoms than others. Learning which foods bother you and which do not can help you come up with an eating plan to manage IC. What are tips for following this plan? You may find it helpful to work with a dietitian. This health care provider can help you develop your eating plan by doing an elimination diet, which involves these steps:  Start with a list of foods that you think trigger your IC symptoms along with the foods that most commonly trigger symptoms for many people with IC.  Eliminate those foods from your diet for about one month, then start reintroducing the foods one at a time to see which ones trigger your symptoms.  Make a list of the foods that trigger your symptoms. It may take several months to find out which foods bother you. Reading food labels Once you know which foods trigger your IC symptoms, you can avoid them. However, it is also a good idea to read food labels because some foods that trigger your symptoms may be included as ingredients in other foods. These ingredients may include:  Chili peppers.  Tomato products.  Soy.  Worcestershire sauce.  Vinegar.  Alcohol.  Citrus flavors or juices.  Artificial  sweeteners.  Monosodium glutamate. Shopping  Shopping can be a challenge if many foods trigger your IC. When you go grocery shopping, bring a list of the foods you can eat.  You can get an app for your phone that lets you know which foods are the safest and which you may want to avoid. You can find the app at the Interstitial Cystitis Network website: www.ic-network.com Meal planning  Plan your meals according to the results of your elimination diet. If you have not done an elimination diet, plan meals according to IC food lists recommended by your health care provider or dietitian. These lists tell you which foods are least and most likely to cause symptoms.  Avoid certain types of food when you go out to eat, such as pizza and foods typically served at [place], [place], and [place]. These foods often contain ingredients that can aggravate IC. General information Here are some general guidelines for an IC eating plan:  Do not eat large portions.  Drink plenty of fluids with your meals.  Do not eat foods that are high in sugar, salt, or saturated fat.  Choose whole fruits instead of juice.  Eat a colorful variety of vegetables. What foods should I eat? For people with IC, the best diet is a balanced one that includes things from all the food groups. Even if you have to avoid certain foods, there are still plenty of healthy choices in each group. The following are some foods that are least bothersome and may be safest to eat: Fruits Bananas. Blueberries and blueberry juice. Melons. Pears. Apples. Dates. Prunes. Raisins. Apricots. Vegetables Asparagus. Avocado. Celery. Beets. Bell peppers. Black olives. Broccoli. Brussels sprouts. Cabbage. Carrots. Cauliflower. Cucumber. Eggplant. Green beans. Potatoes. Radishes. Spinach. Squash. Turnips. Zucchini. Mushrooms. Peas. Grains Oats. Rice. Bran. Oatmeal. Whole wheat bread. Meats and other proteins Beef. Fish and other seafood. Eggs.  Nuts. Peanut butter. Pork. Poultry. Lamb. Garbanzo beans. Pinto beans. Dairy Whole or low-fat milk. American, mozzarella, mild cheddar, feta, ricotta, and cream cheeses. The items listed above may not be a complete list of foods and beverages you can eat. Contact a dietitian for more information. What foods should I avoid? You should avoid any foods that seem to trigger your symptoms. It is also a good idea to avoid foods that are most likely to cause symptoms in many people with IC. These include the following: Fruits Citrus fruits, including lemons, limes, oranges, and grapefruit. Cranberries. Strawberries. Pineapple. Kiwi. Vegetables Chili peppers. Onions. Sauerkraut. Tomato and tomato products. Angie Fava. Grains You do not need to avoid any type of grain unless it triggers your symptoms. Meats and other proteins Precooked or cured meats, such as sausages or meat loaves. Soy products. Dairy Chocolate ice cream. Processed cheese. Yogurt. Beverages Alcohol. Chocolate drinks. Coffee. Cranberry juice. Carbonated drinks. Tea (black, green, or herbal). Tomato juice. Sports drinks. The items listed above may not be a complete list of foods and beverages you should avoid. Contact  a dietitian for more information. Summary  Many people with IC find that certain foods trigger their symptoms. Different foods may be problematic for different people. Some foods are more likely to cause symptoms than others.  You may find it helpful to work with a dietitian to do an elimination diet and come up with an eating plan that is right for you.  Plan your meals according to the results of your elimination diet. If you have not done an elimination diet, plan your meals using IC food lists. These lists tell you which foods are least and most likely to cause symptoms.  The best diet for people with IC is a balanced diet that includes foods from all the food groups. Even if you have to avoid certain foods, there  are still plenty of healthy choices in each group. This information is not intended to replace advice given to you by your health care provider. Make sure you discuss any questions you have with your health care provider. Document Released: 09/02/2017 Document Revised: 09/02/2017 Document Reviewed: 09/02/2017 Elsevier Interactive Patient Education  2019 [place]. Interstitial Cystitis  Interstitial cystitis is inflammation of the bladder. This may cause pain in the bladder area as well as a frequent and urgent need to urinate. The bladder is a hollow organ in the lower part of the abdomen. It stores urine after the urine is made in the kidneys. The severity of interstitial cystitis can vary from person to person. You may have flare-ups, and then your symptoms may go away for a while. For many people, it becomes a long-term (chronic) problem. What are the causes? The cause of this condition is not known. What increases the risk? The following factors may make you more likely to develop this condition:  You are female.  You have fibromyalgia.  You have irritable bowel syndrome (IBS).  You have endometriosis. This condition may be aggravated by:  Stress.  Smoking.  Spicy foods. What are the signs or symptoms? Symptoms of interstitial cystitis vary, and they can change over time. Symptoms may include:  Discomfort or pain in the bladder area, which is in the lower abdomen. Pain can range from mild to severe. The pain may change in intensity as the bladder fills with urine or as it empties.  Pain in the pelvic area, between the hip bones.  An urgent need to urinate.  Frequent urination.  Pain during urination.  Pain during sex.  Blood in the urine. For women, symptoms often get worse during menstruation. How is this diagnosed? This condition is diagnosed based on your symptoms, your medical history, and a physical exam. You may have tests to rule out other conditions, such  as:  Urine tests.  Cystoscopy. For this test, a tool similar to a very thin telescope is used to look into your bladder.  Biopsy. This involves taking a sample of tissue from the bladder to be examined under a microscope. How is this treated? There is no cure for this condition, but treatment can help you control your symptoms. Work closely with your health care provider to find the most effective treatments for you. Treatment options may include:  Medicines to relieve pain and reduce how often you feel the need to urinate.  Learning ways to control when you urinate (bladder training).  Lifestyle changes, such as changing your diet or taking steps to control stress.  Using a device that provides electrical stimulation to your nerves, which can relieve pain (neuromodulation therapy). The  device is placed on your back, where it blocks the nerves that cause you to feel pain in your bladder area.  A procedure that stretches your bladder by filling it with air or fluid.  Surgery. This is rare. It is only done for extreme cases, if other treatments do not help. Follow these instructions at home: Bladder training   Use bladder training techniques as directed. Techniques may include: ? Urinating at scheduled times. ? Training yourself to delay urination. ? Doing exercises (Kegel exercises) to strengthen the muscles that control urine flow.  Keep a bladder diary. ? Write down the times that you urinate and any symptoms that you have. This can help you find out which foods, liquids, or activities make your symptoms worse. ? Use your bladder diary to schedule bathroom trips. If you are away from home, plan to be near a bathroom at each of your scheduled times.  Make sure that you urinate just before you leave the house and just before you go to bed. Eating and drinking  Make dietary changes as recommended by your health care provider. You may need to avoid: ? Spicy foods. ? Foods that  contain a lot of potassium.  Limit your intake of beverages that make you need to urinate. These include: ? Caffeinated beverages like soda, coffee, and tea. ? Alcohol. General instructions  Take over-the-counter and prescription medicines only as told by your health care provider.  Do not drink alcohol.  You can try a warm or cool compress over your bladder for comfort.  Avoid wearing tight clothing.  Do not use any products that contain nicotine or tobacco, such as cigarettes and e-cigarettes. If you need help quitting, ask your health care provider.  Keep all follow-up visits as told by your health care provider. This is important. Contact a health care provider if you have:  Symptoms that do not get better with treatment.  Pain or discomfort that gets worse.  More frequent urges to urinate.  A fever. Get help right away if:  You have no control over when you urinate. Summary  Interstitial cystitis is inflammation of the bladder.  This condition may cause pain in the bladder area as well as a frequent and urgent need to urinate.  You may have flare-ups of the condition, and then it may go away for a while. For many people, it becomes a long-term (chronic) problem.  There is no cure for interstitial cystitis, but treatment methods are available to control your symptoms. This information is not intended to replace advice given to you by your health care provider. Make sure you discuss any questions you have with your health care provider. Document Released: 08/30/2003 Document Revised: 11/23/2016 Document Reviewed: 11/23/2016 Elsevier Interactive Patient Education  2019 [place]   1) The drugs that you were given will stay in your system until tomorrow so for the next 24 hours you should not:  A) Drive an automobile B) Make any legal decisions C) Drink any alcoholic beverage   2) You may resume regular meals  tomorrow.  Today it is better to start with liquids and gradually work up to solid foods.  You may eat anything you prefer, but it is better to start with liquids, then soup and crackers, and gradually work up to solid foods.   3) Please notify your doctor immediately if you have any unusual bleeding, trouble breathing, redness and pain at the surgery site, drainage, fever, or  pain not relieved by medication.    4) Additional Instructions:        Please contact your physician with any problems or Same Day Surgery at [phone], Monday through Friday 6 am to 4 pm, or [place] at [facility] number at [phone].

## 2018-02-09 ENCOUNTER — Encounter: Payer: Self-pay | Admitting: Urology

## 2018-02-10 LAB — URINE CULTURE: Culture: NO GROWTH

## 2018-07-28 ENCOUNTER — Other Ambulatory Visit: Payer: PRIVATE HEALTH INSURANCE

## 2018-07-28 ENCOUNTER — Other Ambulatory Visit: Payer: Self-pay

## 2018-07-28 DIAGNOSIS — R399 Unspecified symptoms and signs involving the genitourinary system: Secondary | ICD-10-CM

## 2018-07-30 LAB — URINE CULTURE

## 2018-08-02 ENCOUNTER — Other Ambulatory Visit: Payer: Self-pay

## 2018-08-02 MED ORDER — SULFAMETHOXAZOLE-TRIMETHOPRIM 800-160 MG PO TABS: 1.0000 | ORAL_TABLET | Freq: Two times a day (BID) | ORAL | 0 refills | Status: DC

## 2018-08-02 NOTE — Telephone Encounter (Signed)
Pt called and went over test results.

## 2018-10-11 ENCOUNTER — Encounter: Payer: Self-pay | Admitting: Obstetrics and Gynecology

## 2018-10-20 ENCOUNTER — Encounter: Payer: Self-pay | Admitting: Obstetrics and Gynecology

## 2018-10-20 ENCOUNTER — Other Ambulatory Visit: Payer: Self-pay

## 2018-10-20 ENCOUNTER — Ambulatory Visit (INDEPENDENT_AMBULATORY_CARE_PROVIDER_SITE_OTHER): Payer: PRIVATE HEALTH INSURANCE | Admitting: Obstetrics and Gynecology

## 2018-10-20 VITALS — BP 118/68 | HR 80 | Ht 68.0 in | Wt 142.7 lb

## 2018-10-20 DIAGNOSIS — N3010 Interstitial cystitis (chronic) without hematuria: Secondary | ICD-10-CM

## 2018-10-20 DIAGNOSIS — Z01419 Encounter for gynecological examination (general) (routine) without abnormal findings: Secondary | ICD-10-CM

## 2018-10-20 DIAGNOSIS — Z30011 Encounter for initial prescription of contraceptive pills: Secondary | ICD-10-CM | POA: Diagnosis not present

## 2018-10-20 DIAGNOSIS — Z862 Personal history of diseases of the blood and blood-forming organs and certain disorders involving the immune mechanism: Secondary | ICD-10-CM

## 2018-10-20 LAB — POCT URINE PREGNANCY: Preg Test, Ur: NEGATIVE

## 2018-10-20 MED ORDER — NORETHIN ACE-ETH ESTRAD-FE 1-20 MG-MCG(24) PO TABS: 1.0000 | ORAL_TABLET | Freq: Every day | ORAL | 3 refills | Status: DC

## 2018-10-20 NOTE — Progress Notes (Signed)
GYNECOLOGY ANNUAL PHYSICAL EXAM PROGRESS NOTE  Subjective:    Monica Price is a 28 y.o. G0P0 female who presents for an annual exam.  The patient is sexually active. The patient wears seatbelts: yes. The patient participates in regular exercise: no. Has the patient ever been transfused or tattooed?: no. The patient reports that there is not domestic violence in her life.   The patient has the following concerns today:  1. Desires to discuss birth control. Husband just had vasectomy reversal.  Would like to use OCPs.   Gynecologic History Menarche age: 31 Patient's last menstrual period was 10/15/2018. Contraception: vasectomy however just had a reversal.  History of STI's: Denies  Last Pap: 08/2014. Results were: normal.  Denies h/o abnormal pap smears.    OB History  Gravida Para Term Preterm AB Living  0 0 0 0 0 0  SAB TAB Ectopic Multiple Live Births  0 0 0 0 0    Past Medical History:  Diagnosis Date  . Anemia   . GERD (gastroesophageal reflux disease)   . Interstitial cystitis   . Microscopic hematuria   . Recurrent UTI    Pt has been treated for kidney infection and UTI for 10 months    Past Surgical History:  Procedure Laterality Date  . CYSTO WITH HYDRODISTENSION N/A 02/08/2018   Procedure: CYSTOSCOPY/HYDRODISTENSION;  Surgeon: Royston Cowper, MD;  Location: [facility] ORS;  Service: Urology;  Laterality: N/A;  . WISDOM TOOTH EXTRACTION      Family History  Problem Relation Age of Onset  . Healthy Mother   . Healthy Father   . Bladder Cancer Maternal Aunt        great  . Bladder Cancer Maternal Uncle        great  . Kidney Stones Maternal Grandmother   . Throat cancer Maternal Grandfather   . Kidney disease Neg Hx   . Prostate cancer Neg Hx   . Kidney cancer Neg Hx     Social History   Socioeconomic History  . Marital status: Married    Spouse name: Not on file  . Number of children: Not on file  . Years of education: Not on file  .  Highest education level: Not on file  Occupational History  . Not on file  Social Needs  . Financial resource strain: Not on file  . Food insecurity    Worry: Not on file    Inability: Not on file  . Transportation needs    Medical: Not on file    Non-medical: Not on file  Tobacco Use  . Smoking status: Never Smoker  . Smokeless tobacco: Never Used  Substance and Sexual Activity  . Alcohol use: Yes    Frequency: Never    Comment: RARE  . Drug use: Never  . Sexual activity: Yes    Birth control/protection: None    Comment: Partner has vasectomy   Lifestyle  . Physical activity    Days per week: Not on file    Minutes per session: Not on file  . Stress: Not on file  Relationships  . Social Herbalist on phone: Not on file    Gets together: Not on file    Attends religious service: Not on file    Active member of club or organization: Not on file    Attends meetings of clubs or organizations: Not on file    Relationship status: Not on file  . Intimate partner  violence    Fear of current or ex partner: Not on file    Emotionally abused: Not on file    Physically abused: Not on file    Forced sexual activity: Not on file  Other Topics Concern  . Not on file  Social History Narrative   ** Merged History Encounter **        Current Outpatient Medications on File Prior to Visit  Medication Sig Dispense Refill  . amitriptyline (ELAVIL) 25 MG tablet Take 25 mg by mouth at bedtime.    . Ferrous Sulfate (IRON) 325 (65 Fe) MG TABS Take 1 tablet (325 mg total) by mouth daily. 30 each 1  . hydrOXYzine (VISTARIL) 25 MG capsule Take 1 capsule (25 mg total) by mouth at bedtime. 90 capsule 3  . pentosan polysulfate (ELMIRON) 100 MG capsule Take 1 capsule (100 mg total) by mouth 3 (three) times daily. 270 capsule 3  . Meth-Hyo-M Bl-Na Phos-Ph Sal (URIBEL) 118 MG CAPS Take 1 capsule by mouth as needed.    . mirabegron ER (MYRBETRIQ) 50 MG TB24 tablet Take 50 mg by mouth as  needed.     . nitrofurantoin (MACRODANTIN) 100 MG capsule Take 1 capsule (100 mg total) by mouth at bedtime. (Patient not taking: Reported on 10/20/2018) 90 capsule 0  . nitrofurantoin (MACRODANTIN) 100 MG capsule Take 100 mg by mouth as needed.     . sulfamethoxazole-trimethoprim (BACTRIM DS) 800-160 MG tablet Take 1 tablet by mouth 2 (two) times daily. Take 1 tablet by mouth 2 times daily for 3 days. (Patient not taking: Reported on 10/20/2018) 6 tablet 0   No current facility-administered medications on file prior to visit.     No Known Allergies   Review of Systems Constitutional: negative for chills, fatigue, fevers and sweats Eyes: negative for irritation, redness and visual disturbance Ears, nose, mouth, throat, and face: negative for hearing loss, nasal congestion, snoring and tinnitus Respiratory: negative for asthma, cough, sputum Cardiovascular: negative for chest pain, dyspnea, exertional chest pressure/discomfort, irregular heart beat, palpitations and syncope Gastrointestinal: negative for abdominal pain, change in bowel habits, nausea and vomiting Genitourinary:  Negative for dysuria, genital lesions, sexual problems and vaginal discharge, and urinary incontinence Integument/breast: negative for breast lump, breast tenderness and nipple discharge Hematologic/lymphatic: negative for bleeding and easy bruising Musculoskeletal:negative for back pain and muscle weakness Neurological: negative for dizziness, headaches, vertigo and weakness Endocrine: negative for diabetic symptoms including polydipsia, polyuria and skin dryness Allergic/Immunologic: negative for hay fever and urticaria     Objective:  Blood pressure 118/68, pulse 80, height 5\' 8"  (1.727 m), weight 142 lb 11.2 oz (64.7 kg), last menstrual period 10/15/2018. Body mass index is 21.7 kg/m.  General Appearance:    Alert, cooperative, no distress, appears stated age  Head:    Normocephalic, without obvious  abnormality, atraumatic  Eyes:    PERRL, conjunctiva/corneas clear, EOM's intact, both eyes  Ears:    Normal external ear canals, both ears  Nose:   Nares normal, septum midline, mucosa normal, no drainage or sinus tenderness  Throat:   Lips, mucosa, and tongue normal; teeth and gums normal  Neck:   Supple, symmetrical, trachea midline, no adenopathy; thyroid: no enlargement/tenderness/nodules; no carotid bruit or JVD  Back:     Symmetric, no curvature, ROM normal, no CVA tenderness  Lungs:     Clear to auscultation bilaterally, respirations unlabored  Chest Wall:    No tenderness or deformity   Heart:    Regular rate  and rhythm, S1 and S2 normal, no murmur, rub or gallop  Breast Exam:    No tenderness, masses, or nipple abnormality  Abdomen:     Soft, non-tender, bowel sounds active all four quadrants, no masses, no organomegaly.    Genitalia:    Pelvic:external genitalia normal, vagina without lesions, discharge, or tenderness, rectovaginal septum  normal. Cervix normal in appearance, no cervical motion tenderness, no adnexal masses or tenderness.  Uterus normal size, shape, mobile, regular contours, nontender.  Rectal:    Normal external sphincter.  No hemorrhoids appreciated. Internal exam not done.   Extremities:   Extremities normal, atraumatic, no cyanosis or edema  Pulses:   2+ and symmetric all extremities  Skin:   Skin color, texture, turgor normal, no rashes or lesions  Lymph nodes:   Cervical, supraclavicular, and axillary nodes normal  Neurologic:   CNII-XII intact, normal strength, sensation and reflexes throughout   .  Labs:  Lab Results  Component Value Date   WBC 4.6 10/08/2017   HGB 13.0 01/31/2018   HCT 32.9 (L) 10/08/2017   MCV 73 (L) 10/08/2017   PLT 228 10/08/2017    Lab Results  Component Value Date   CREATININE 0.80 10/08/2017   BUN 13 10/08/2017   NA 142 10/08/2017   K 4.7 10/08/2017   CL 103 10/08/2017   CO2 24 10/08/2017    Lab Results  Component  Value Date   ALT 10 10/08/2017   AST 18 10/08/2017   ALKPHOS 43 10/08/2017   BILITOT 0.4 10/08/2017    Lab Results  Component Value Date   TSH 0.977 04/01/2017    Results for orders placed or performed in visit on 10/20/18  POCT urine pregnancy  Result Value Ref Range   Preg Test, Ur Negative Negative     Assessment:   Routine gynecologic exam.  Contraception management History of anemia Interstitial cystitis  Plan:  Blood tests: CBC, CMP performed.  Breast self exam technique reviewed and patient encouraged to perform self-exam monthly. Contraception: vasectomy reversed, plans for conception in the next 6 months or so, but desires to hold off on pregnancy for now. Can do Sunday start. UPT negative today.  Pap smear up to date.   Discussed healthy lifestyle modifications. Declines flu vaccine.  Interstitial cystitis managed by Urologist at [facility]. Doing self-catheterization with bladder instillation.  Follow up in 1 year.    Rubie Maid, MD [facility]

## 2018-10-20 NOTE — Patient Instructions (Signed)
Health Maintenance, Female Adopting a healthy lifestyle and getting preventive care are important in promoting health and wellness. Ask your health care provider about:  The right schedule for you to have regular tests and exams.  Things you can do on your own to prevent diseases and keep yourself healthy. What should I know about diet, weight, and exercise? Eat a healthy diet   Eat a diet that includes plenty of vegetables, fruits, low-fat dairy products, and lean protein.  Do not eat a lot of foods that are high in solid fats, added sugars, or sodium. Maintain a healthy weight Body mass index (BMI) is used to identify weight problems. It estimates body fat based on height and weight. Your health care provider can help determine your BMI and help you achieve or maintain a healthy weight. Get regular exercise Get regular exercise. This is one of the most important things you can do for your health. Most adults should:  Exercise for at least 150 minutes each week. The exercise should increase your heart rate and make you sweat (moderate-intensity exercise).  Do strengthening exercises at least twice a week. This is in addition to the moderate-intensity exercise.  Spend less time sitting. Even light physical activity can be beneficial. Watch cholesterol and blood lipids Have your blood tested for lipids and cholesterol at 27 years of age, then have this test every 5 years. Have your cholesterol levels checked more often if:  Your lipid or cholesterol levels are high.  You are older than 27 years of age.  You are at high risk for heart disease. What should I know about cancer screening? Depending on your health history and family history, you may need to have cancer screening at various ages. This may include screening for:  Breast cancer.  Cervical cancer.  Colorectal cancer.  Skin cancer.  Lung cancer. What should I know about heart disease, diabetes, and high blood  pressure? Blood pressure and heart disease  High blood pressure causes heart disease and increases the risk of stroke. This is more likely to develop in people who have high blood pressure readings, are of African descent, or are overweight.  Have your blood pressure checked: ? Every 3-5 years if you are 68-56 years of age. ? Every year if you are 44 years old or older. Diabetes Have regular diabetes screenings. This checks your fasting blood sugar level. Have the screening done:  Once every three years after age 35 if you are at a normal weight and have a low risk for diabetes.  More often and at a younger age if you are overweight or have a high risk for diabetes. What should I know about preventing infection? Hepatitis B If you have a higher risk for hepatitis B, you should be screened for this virus. Talk with your health care provider to find out if you are at risk for hepatitis B infection. Hepatitis C Testing is recommended for:  Everyone born from [birth year] through 1964.  Anyone with known risk factors for hepatitis C. Sexually transmitted infections (STIs)  Get screened for STIs, including gonorrhea and chlamydia, if: ? You are sexually active and are younger than 27 years of age. ? You are older than 27 years of age and your health care provider tells you that you are at risk for this type of infection. ? Your sexual activity has changed since you were last screened, and you are at increased risk for chlamydia or gonorrhea. Ask your health care provider if  you are at risk.  Ask your health care provider about whether you are at high risk for HIV. Your health care provider may recommend a prescription medicine to help prevent HIV infection. If you choose to take medicine to prevent HIV, you should first get tested for HIV. You should then be tested every 3 months for as long as you are taking the medicine. Pregnancy  If you are about to stop having your period (premenopausal) and  you may become pregnant, seek counseling before you get pregnant.  Take 400 to 800 micrograms (mcg) of folic acid every day if you become pregnant.  Ask for birth control (contraception) if you want to prevent pregnancy. Osteoporosis and menopause Osteoporosis is a disease in which the bones lose minerals and strength with aging. This can result in bone fractures. If you are 1 years old or older, or if you are at risk for osteoporosis and fractures, ask your health care provider if you should:  Be screened for bone loss.  Take a calcium or vitamin D supplement to lower your risk of fractures.  Be given hormone replacement therapy (HRT) to treat symptoms of menopause. Follow these instructions at home: Lifestyle  Do not use any products that contain nicotine or tobacco, such as cigarettes, e-cigarettes, and chewing tobacco. If you need help quitting, ask your health care provider.  Do not use street drugs.  Do not share needles.  Ask your health care provider for help if you need support or information about quitting drugs. Alcohol use  Do not drink alcohol if: ? Your health care provider tells you not to drink. ? You are pregnant, may be pregnant, or are planning to become pregnant.  If you drink alcohol: ? Limit how much you use to 0-1 drink a day. ? Limit intake if you are breastfeeding.  Be aware of how much alcohol is in your drink. In the [place] one drink equals one 12 oz bottle of beer (355 mL), one 5 oz glass of wine (148 mL), or one 1 oz glass of hard liquor (44 mL). General instructions  Schedule regular health, dental, and eye exams.  Stay current with your vaccines.  Tell your health care provider if: ? You often feel depressed. ? You have ever been abused or do not feel safe at home. Summary  Adopting a healthy lifestyle and getting preventive care are important in promoting health and wellness.  Follow your health care provider's instructions about healthy  diet, exercising, and getting tested or screened for diseases.  Follow your health care provider's instructions on monitoring your cholesterol and blood pressure. This information is not intended to replace advice given to you by your health care provider. Make sure you discuss any questions you have with your health care provider. Document Released: 07/14/2010 Document Revised: 12/22/2017 Document Reviewed: 12/22/2017 Elsevier Patient Education  2020 [employer].    Oral Contraception Information Oral contraceptive pills (OCPs) are medicines taken to prevent pregnancy. OCPs are taken by mouth, and they work by:  Preventing the ovaries from releasing eggs.  Thickening mucus in the lower part of the uterus (cervix), which prevents sperm from entering the uterus.  Thinning the lining of the uterus (endometrium), which prevents a fertilized egg from attaching to the endometrium. OCPs are highly effective when taken exactly as prescribed. However, OCPs do not prevent STIs (sexually transmitted infections). Safe sex practices, such as using condoms while on an OCP, can help prevent STIs. Before starting OCPs Before you  start taking OCPs, you may have a physical exam, blood test, and Pap test. However, you are not required to have a pelvic exam in order to be prescribed OCPs. Your health care provider will make sure you are a good candidate for oral contraception. OCPs are not a good option for certain women, including women who smoke and are older than 35 years, and women with a medical history of high blood pressure, deep vein thrombosis, pulmonary embolism, stroke, cardiovascular disease, or peripheral vascular disease. Discuss with your health care provider the possible side effects of the OCP you may be prescribed. When you start an OCP, be aware that it can take 2-3 months for your body to adjust to changes in hormone levels. Follow instructions from your health care provider about how to  start taking your first cycle of OCPs. Depending on when you start the pill, you may need to use a backup form of birth control, such as condoms, during the first week. Make sure you know what steps to take if you ever forget to take the pill. Types of oral contraception  The most common types of birth control pills contain the hormones estrogen and progestin (synthetic progesterone) or progestin only. The combination pill This type of pill contains estrogen and progestin hormones. Combination pills often come in packs of 21, 28, or 91 pills. For each pack, the last 7 pills may not contain hormones, which means you may stop taking the pills for 7 days. Menstrual bleeding occurs during the week that you do not take the pills or that you take the pills with no hormones in them. The minipill This type of pill contains the progestin hormone only. It comes in packs of 28 pills. All 28 pills contain the hormone. You take the pill every day. It is very important to take the pill at the same time each day. Advantages of oral contraceptive pills  Provides reliable and continuous contraception if taken as instructed.  May treat or decrease symptoms of: ? Menstrual period cramps. ? Irregular menstrual cycle or bleeding. ? Heavy menstrual flow. ? Abnormal uterine bleeding. ? Acne, depending on the type of pill. ? Polycystic ovarian syndrome. ? Endometriosis. ? Iron deficiency anemia. ? Premenstrual symptoms, including premenstrual dysphoric disorder.  May reduce the risk of endometrial and ovarian cancer.  Can be used as emergency contraception.  Prevents mislocated (ectopic) pregnancies and infections of the fallopian tubes. Things that can make oral contraceptive pills less effective OCPs can be less effective if:  You forget to take the pill at the same time every day. This is especially important when taking the minipill.  You have a stomach or intestinal disease that reduces your body's  ability to absorb the pill.  You take OCPs with other medicines that make OCPs less effective, such as antibiotics, certain HIV medicines, and some seizure medicines.  You take expired OCPs.  You forget to restart the pill on day 7, if using the packs of 21 pills. Risks associated with oral contraceptive pills Oral contraceptive pills can sometimes cause side effects, such as:  Headache.  Depression.  Trouble sleeping.  Nausea and vomiting.  Breast tenderness.  Irregular bleeding or spotting during the first several months.  Bloating or fluid retention.  Increase in blood pressure. Combination pills are also associated with a small increase in the risk of:  Blood clots.  Heart attack.  Stroke. Summary  Oral contraceptive pills are medicines taken by mouth to prevent pregnancy. They are  highly effective when taken exactly as prescribed.  The most common types of birth control pills contain the hormones estrogen and progestin (synthetic progesterone) or progestin only.  Before you start taking the pill, you may have a physical exam, blood test, and Pap test. Your health care provider will make sure you are a good candidate for oral contraception.  The combination pill may come in a 21-day pack, a 28-day pack, or a 91-day pack. The minipill contains the progesterone hormone only and comes in packs of 28 pills.  Oral contraceptive pills can sometimes cause side effects, such as headache, nausea, breast tenderness, or irregular bleeding. This information is not intended to replace advice given to you by your health care provider. Make sure you discuss any questions you have with your health care provider. Document Released: 03/21/2002 Document Revised: 12/11/2016 Document Reviewed: 03/24/2016 Elsevier Patient Education  2020 [employer].

## 2018-10-20 NOTE — Progress Notes (Signed)
Pt present for annual exam. Pt stated that she was doing well no problems. Pt stated wanting to start birth control pills. UPT= neg.

## 2018-10-21 LAB — COMPREHENSIVE METABOLIC PANEL
ALT: 22 IU/L (ref 0–32)
AST: 22 IU/L (ref 0–40)
Albumin/Globulin Ratio: 2.1 (ref 1.2–2.2)
Albumin: 4.6 g/dL (ref 3.9–5.0)
Alkaline Phosphatase: 60 IU/L (ref 39–117)
BUN/Creatinine Ratio: 11 (ref 9–23)
BUN: 10 mg/dL (ref 6–20)
Bilirubin Total: 0.4 mg/dL (ref 0.0–1.2)
CO2: 26 mmol/L (ref 20–29)
Calcium: 9.5 mg/dL (ref 8.7–10.2)
Chloride: 99 mmol/L (ref 96–106)
Creatinine, Ser: 0.88 mg/dL (ref 0.57–1.00)
GFR calc Af Amer: 104 mL/min/{1.73_m2} (ref >59)
GFR calc non Af Amer: 90 mL/min/{1.73_m2} (ref >59)
Globulin, Total: 2.2 g/dL (ref 1.5–4.5)
Glucose: 88 mg/dL (ref 65–99)
Potassium: 4.1 mmol/L (ref 3.5–5.2)
Sodium: 139 mmol/L (ref 134–144)
Total Protein: 6.8 g/dL (ref 6.0–8.5)

## 2018-10-21 LAB — CBC
Hematocrit: 38.6 % (ref 34.0–46.6)
Hemoglobin: 12.7 g/dL (ref 11.1–15.9)
MCH: 29.4 pg (ref 26.6–33.0)
MCHC: 32.9 g/dL (ref 31.5–35.7)
MCV: 89 fL (ref 79–97)
Platelets: 186 10*3/uL (ref 150–450)
RBC: 4.32 x10E6/uL (ref 3.77–5.28)
RDW: 12.5 % (ref 11.7–15.4)
WBC: 4.6 10*3/uL (ref 3.4–10.8)

## 2019-04-24 ENCOUNTER — Telehealth: Payer: Self-pay | Admitting: Obstetrics and Gynecology

## 2019-04-24 ENCOUNTER — Other Ambulatory Visit: Payer: Self-pay

## 2019-04-24 ENCOUNTER — Ambulatory Visit (INDEPENDENT_AMBULATORY_CARE_PROVIDER_SITE_OTHER): Payer: PRIVATE HEALTH INSURANCE | Admitting: Primary Care

## 2019-04-24 ENCOUNTER — Encounter: Payer: Self-pay | Admitting: Primary Care

## 2019-04-24 VITALS — BP 126/86 | HR 88 | Temp 97.2°F | Ht 68.0 in | Wt 148.5 lb

## 2019-04-24 DIAGNOSIS — M542 Cervicalgia: Secondary | ICD-10-CM | POA: Diagnosis not present

## 2019-04-24 MED ORDER — NAPROXEN SODIUM 550 MG PO TABS: 550.0000 mg | ORAL_TABLET | Freq: Two times a day (BID) | ORAL | 0 refills | Status: DC

## 2019-04-24 MED ORDER — NAPROXEN 500 MG PO TABS: 500.0000 mg | ORAL_TABLET | Freq: Two times a day (BID) | ORAL | 0 refills | Status: DC

## 2019-04-24 NOTE — Telephone Encounter (Signed)
Per Anda Kraft okay to change to 500 mg. New Rx have sent and notified pharmacy.

## 2019-04-24 NOTE — Patient Instructions (Signed)
You may take naproxen 500 mg twice daily with food as needed for neck pain.  Apply ice/heat.   Please update me in 1-2 weeks as discussed.   It was a pleasure to see you today!
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
## 2019-04-24 NOTE — Telephone Encounter (Signed)
Rx sent today for naproxen sodium (ANAPROX DS) 550 MG [facility] does not have this in stock, ONLY the 500mg   Please advise, thanks.
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
## 2019-05-02 ENCOUNTER — Other Ambulatory Visit: Payer: Self-pay | Admitting: Urology

## 2019-10-25 ENCOUNTER — Other Ambulatory Visit: Payer: Self-pay

## 2019-10-25 ENCOUNTER — Ambulatory Visit (INDEPENDENT_AMBULATORY_CARE_PROVIDER_SITE_OTHER): Payer: No Typology Code available for payment source | Admitting: Obstetrics and Gynecology

## 2019-10-25 ENCOUNTER — Encounter: Payer: Self-pay | Admitting: Obstetrics and Gynecology

## 2019-10-25 VITALS — BP 129/77 | HR 85 | Ht 68.0 in | Wt 147.9 lb

## 2019-10-25 DIAGNOSIS — M545 Low back pain, unspecified: Secondary | ICD-10-CM

## 2019-10-25 DIAGNOSIS — N970 Female infertility associated with anovulation: Secondary | ICD-10-CM

## 2019-10-25 DIAGNOSIS — Z01419 Encounter for gynecological examination (general) (routine) without abnormal findings: Secondary | ICD-10-CM | POA: Diagnosis not present

## 2019-10-25 DIAGNOSIS — M41129 Adolescent idiopathic scoliosis, site unspecified: Secondary | ICD-10-CM | POA: Diagnosis not present

## 2019-10-25 DIAGNOSIS — Z3169 Encounter for other general counseling and advice on procreation: Secondary | ICD-10-CM

## 2019-10-25 NOTE — Progress Notes (Signed)
Pt present for annual exam. Pt's pap is up to date, declined COVID vaccine and will get flu vaccine later on. Pt stated she is still having irregular/ prolonged cycles.

## 2019-10-25 NOTE — Patient Instructions (Signed)
Health Maintenance, Female Adopting a healthy lifestyle and getting preventive care are important in promoting health and wellness. Ask your health care provider about:  The right schedule for you to have regular tests and exams.  Things you can do on your own to prevent diseases and keep yourself healthy. What should I know about diet, weight, and exercise? Eat a healthy diet   Eat a diet that includes plenty of vegetables, fruits, low-fat dairy products, and lean protein.  Do not eat a lot of foods that are high in solid fats, added sugars, or sodium. Maintain a healthy weight Body mass index (BMI) is used to identify weight problems. It estimates body fat based on height and weight. Your health care provider can help determine your BMI and help you achieve or maintain a healthy weight. Get regular exercise Get regular exercise. This is one of the most important things you can do for your health. Most adults should:  Exercise for at least 150 minutes each week. The exercise should increase your heart rate and make you sweat (moderate-intensity exercise).  Do strengthening exercises at least twice a week. This is in addition to the moderate-intensity exercise.  Spend less time sitting. Even light physical activity can be beneficial. Watch cholesterol and blood lipids Have your blood tested for lipids and cholesterol at 28 years of age, then have this test every 5 years. Have your cholesterol levels checked more often if:  Your lipid or cholesterol levels are high.  You are older than 28 years of age.  You are at high risk for heart disease. What should I know about cancer screening? Depending on your health history and family history, you may need to have cancer screening at various ages. This may include screening for:  Breast cancer.  Cervical cancer.  Colorectal cancer.  Skin cancer.  Lung cancer. What should I know about heart disease, diabetes, and high blood  pressure? Blood pressure and heart disease  High blood pressure causes heart disease and increases the risk of stroke. This is more likely to develop in people who have high blood pressure readings, are of African descent, or are overweight.  Have your blood pressure checked: ? Every 3-5 years if you are 18-39 years of age. ? Every year if you are 40 years old or older. Diabetes Have regular diabetes screenings. This checks your fasting blood sugar level. Have the screening done:  Once every three years after age 40 if you are at a normal weight and have a low risk for diabetes.  More often and at a younger age if you are overweight or have a high risk for diabetes. What should I know about preventing infection? Hepatitis B If you have a higher risk for hepatitis B, you should be screened for this virus. Talk with your health care provider to find out if you are at risk for hepatitis B infection. Hepatitis C Testing is recommended for:  Everyone born from 1945 through 1965.  Anyone with known risk factors for hepatitis C. Sexually transmitted infections (STIs)  Get screened for STIs, including gonorrhea and chlamydia, if: ? You are sexually active and are younger than 28 years of age. ? You are older than 28 years of age and your health care provider tells you that you are at risk for this type of infection. ? Your sexual activity has changed since you were last screened, and you are at increased risk for chlamydia or gonorrhea. Ask your health care provider if   you are at risk.  Ask your health care provider about whether you are at high risk for HIV. Your health care provider may recommend a prescription medicine to help prevent HIV infection. If you choose to take medicine to prevent HIV, you should first get tested for HIV. You should then be tested every 3 months for as long as you are taking the medicine. Pregnancy  If you are about to stop having your period (premenopausal) and  you may become pregnant, seek counseling before you get pregnant.  Take 400 to 800 micrograms (mcg) of folic acid every day if you become pregnant.  Ask for birth control (contraception) if you want to prevent pregnancy. Osteoporosis and menopause Osteoporosis is a disease in which the bones lose minerals and strength with aging. This can result in bone fractures. If you are 84 years old or older, or if you are at risk for osteoporosis and fractures, ask your health care provider if you should:  Be screened for bone loss.  Take a calcium or vitamin D supplement to lower your risk of fractures.  Be given hormone replacement therapy (HRT) to treat symptoms of menopause. Follow these instructions at home: Lifestyle  Do not use any products that contain nicotine or tobacco, such as cigarettes, e-cigarettes, and chewing tobacco. If you need help quitting, ask your health care provider.  Do not use street drugs.  Do not share needles.  Ask your health care provider for help if you need support or information about quitting drugs. Alcohol use  Do not drink alcohol if: ? Your health care provider tells you not to drink. ? You are pregnant, may be pregnant, or are planning to become pregnant.  If you drink alcohol: ? Limit how much you use to 0-1 drink a day. ? Limit intake if you are breastfeeding.  Be aware of how much alcohol is in your drink. In the [place] one drink equals one 12 oz bottle of beer (355 mL), one 5 oz glass of wine (148 mL), or one 1 oz glass of hard liquor (44 mL). General instructions  Schedule regular health, dental, and eye exams.  Stay current with your vaccines.  Tell your health care provider if: ? You often feel depressed. ? You have ever been abused or do not feel safe at home. Summary  Adopting a healthy lifestyle and getting preventive care are important in promoting health and wellness.  Follow your health care provider's instructions about healthy  diet, exercising, and getting tested or screened for diseases.  Follow your health care provider's instructions on monitoring your cholesterol and blood pressure. This information is not intended to replace advice given to you by your health care provider. Make sure you discuss any questions you have with your health care provider. Document Revised: 12/22/2017 Document Reviewed: 12/22/2017 Elsevier Patient Education  2020 [place] Breast self-awareness means being familiar with how your breasts look and feel. It involves checking your breasts regularly and reporting any changes to your health care provider. Practicing breast self-awareness is important. Sometimes changes may not be harmful (are benign), but sometimes a change in your breasts can be a sign of a serious medical problem. It is important to learn how to do this procedure correctly so that you can catch problems early, when treatment is more likely to be successful. All women should practice breast self-awareness, including women who have had breast implants. What you need:  A mirror.  A  well-lit room. How to do a breast self-exam A breast self-exam is one way to learn what is normal for your breasts and whether your breasts are changing. To do a breast self-exam: Look for changes  1. Remove all the clothing above your waist. 2. Stand in front of a mirror in a room with good lighting. 3. Put your hands on your hips. 4. Push your hands firmly downward. 5. Compare your breasts in the mirror. Look for differences between them (asymmetry), such as: ? Differences in shape. ? Differences in size. ? Puckers, dips, and bumps in one breast and not the other. 6. Look at each breast for changes in the skin, such as: ? Redness. ? Scaly areas. 7. Look for changes in your nipples, such as: ? Discharge. ? Bleeding. ? Dimpling. ? Redness. ? A change in position. Feel for changes Carefully feel your  breasts for lumps and changes. It is best to do this while lying on your back on the floor, and again while sitting or standing in the tub or shower with soapy water on your skin. Feel each breast in the following way: 1. Place the arm on the side of the breast you are examining above your head. 2. Feel your breast with the other hand. 3. Start in the nipple area and make -inch (2 cm) overlapping circles to feel your breast. Use the pads of your three middle fingers to do this. Apply light pressure, then medium pressure, then firm pressure. The light pressure will allow you to feel the tissue closest to the skin. The medium pressure will allow you to feel the tissue that is a little deeper. The firm pressure will allow you to feel the tissue close to the ribs. 4. Continue the overlapping circles, moving downward over the breast until you feel your ribs below your breast. 5. Move one finger-width toward the center of the body. Continue to use the -inch (2 cm) overlapping circles to feel your breast as you move slowly up toward your collarbone. 6. Continue the up-and-down exam using all three pressures until you reach your armpit.  Write down what you find Writing down what you find can help you remember what to discuss with your health care provider. Write down:  What is normal for each breast.  Any changes that you find in each breast, including: ? The kind of changes you find. ? Any pain or tenderness. ? Size and location of any lumps.  Where you are in your menstrual cycle, if you are still menstruating. General tips and recommendations  Examine your breasts every month.  If you are breastfeeding, the best time to examine your breasts is after a feeding or after using a breast pump.  If you menstruate, the best time to examine your breasts is 5-7 days after your period. Breasts are generally lumpier during menstrual periods, and it may be more difficult to notice changes.  With time  and practice, you will become more familiar with the variations in your breasts and more comfortable with the exam. Contact a health care provider if you:  See a change in the shape or size of your breasts or nipples.  See a change in the skin of your breast or nipples, such as a reddened or scaly area.  Have unusual discharge from your nipples.  Find a lump or thick area that was not there before.  Have pain in your breasts.  Have any concerns related to your breast health. Summary  Breast self-awareness includes looking for physical changes in your breasts, as well as feeling for any changes within your breasts.  Breast self-awareness should be performed in front of a mirror in a well-lit room.  You should examine your breasts every month. If you menstruate, the best time to examine your breasts is 5-7 days after your menstrual period.  Let your health care provider know of any changes you notice in your breasts, including changes in size, changes on the skin, pain or tenderness, or unusual fluid from your nipples. This information is not intended to replace advice given to you by your health care provider. Make sure you discuss any questions you have with your health care provider. Document Revised: 08/17/2017 Document Reviewed: 08/17/2017 Elsevier Patient Education  [place].   Dysfunctional Uterine Bleeding Dysfunctional uterine bleeding is abnormal bleeding from the uterus. Dysfunctional uterine bleeding includes:  A menstrual period that comes earlier or later than usual.  A menstrual period that is lighter or heavier than usual, or has large blood clots.  Vaginal bleeding between menstrual periods.  Skipping one or more menstrual periods.  Vaginal bleeding after sex.  Vaginal bleeding after menopause. Follow these instructions at home: Eating and drinking   Eat well-balanced meals. Include foods that are high in iron, such as liver, meat, shellfish,  green leafy vegetables, and eggs.  To prevent or treat constipation, your health care provider may recommend that you: ? Drink enough fluid to keep your urine pale yellow. ? Take over-the-counter or prescription medicines. ? Eat foods that are high in fiber, such as beans, whole grains, and fresh fruits and vegetables. ? Limit foods that are high in fat and processed sugars, such as fried or sweet foods. Medicines  Take over-the-counter and prescription medicines only as told by your health care provider.  Do not change medicines without talking with your health care provider.  Aspirin or medicines that contain aspirin may make the bleeding worse. Do not take those medicines: ? During the week before your menstrual period. ? During your menstrual period.  If you were prescribed iron pills, take them as told by your health care provider. Iron pills help to replace iron that your body loses because of this condition. Activity  If you need to change your sanitary pad or tampon more than one time every 2 hours: ? Lie in bed with your feet raised (elevated). ? Place a cold pack on your lower abdomen. ? Rest as much as possible until the bleeding stops or slows down.  Do not try to lose weight until the bleeding has stopped and your blood iron level is back to normal. General instructions   For two months, write down: ? When your menstrual period starts. ? When your menstrual period ends. ? When any abnormal vaginal bleeding occurs. ? What problems you notice.  Keep all follow up visits as told by your health care provider. This is important. Contact a health care provider if you:  Feel light-headed or weak.  Have nausea and vomiting.  Cannot eat or drink without vomiting.  Feel dizzy or have diarrhea while you are taking medicines.  Are taking birth control pills or hormones, and you want to change them or stop taking them. Get help right away if:  You develop a fever or  chills.  You need to change your sanitary pad or tampon more than one time per hour.  Your vaginal bleeding becomes heavier, or your flow contains clots more often.  You develop pain in your abdomen.  You lose consciousness.  You develop a rash. Summary  Dysfunctional uterine bleeding is abnormal bleeding from the uterus.  It includes menstrual bleeding of abnormal duration, volume, or regularity.  Bleeding after sex and after menopause are also considered dysfunctional uterine bleeding. This information is not intended to replace advice given to you by your health care provider. Make sure you discuss any questions you have with your health care provider. Document Revised: 06/09/2017 Document Reviewed: 06/09/2017 Elsevier Patient Education  [place].

## 2019-10-25 NOTE — Progress Notes (Signed)
GYNECOLOGY ANNUAL PHYSICAL EXAM PROGRESS NOTE  Subjective:    Monica Price is a 29 y.o. G0P0 female who presents for an annual exam.  The patient is sexually active. The patient wears seatbelts: yes. The patient participates in regular exercise: no. Has the patient ever been transfused or tattooed?: no. The patient reports that there is not domestic violence in her life.   The patient has the following concerns today:  1. Reports that her cycles are prolonged and heavy.  Also notes that they are irregular months between 21 -56 days. This has been her norm since onset of menarche, however had been on OCPs for the past several years so were more regulated. Discontinued last year as she and her partner desired to conceive. Does report having ovulation testing done (through mail-in service) and was noted that her [facility] was low. Also notes taking an ovulation kit test for several months and never had a positive test.   Gynecologic History Menarche age: 52 Patient's last menstrual period was 10/16/2019. Contraception: none (partner had vasectomy reversed last year, age 20).   History of STI's: Denies  Last Pap: 10/08/2017. Results were: normal.  Denies h/o abnormal pap smears.    Upstream - 10/25/19 1926      Pregnancy Intention Screening   Does the patient want to become pregnant in the next year? Yes    Does the patient's partner want to become pregnant in the next year? Yes    Would the patient like to discuss contraceptive options today? No      Contraception Wrap Up   Current Method No Method - Other Reason    End Method Pregnant/Seeking Pregnancy    Contraception Counseling Provided No            The pregnancy intention screening data noted above was reviewed. Potential methods of contraception were not discussed. The patient elected to proceed with Pregnant/Seeking Pregnancy.    OB History  Gravida Para Term Preterm AB Living  0 0 0 0 0 0  SAB TAB Ectopic Multiple  Live Births  0 0 0 0 0    Past Medical History:  Diagnosis Date  . Anemia   . GERD (gastroesophageal reflux disease)   . Interstitial cystitis   . Microscopic hematuria   . Recurrent UTI    Pt has been treated for kidney infection and UTI for 10 months    Past Surgical History:  Procedure Laterality Date  . CYSTO WITH HYDRODISTENSION N/A 02/08/2018   Procedure: CYSTOSCOPY/HYDRODISTENSION;  Surgeon: Royston Cowper, MD;  Location: [facility] ORS;  Service: Urology;  Laterality: N/A;  . WISDOM TOOTH EXTRACTION      Family History  Problem Relation Age of Onset  . Healthy Mother   . Healthy Father   . Bladder Cancer Maternal Aunt        great  . Bladder Cancer Maternal Uncle        great  . Kidney Stones Maternal Grandmother   . Throat cancer Maternal Grandfather   . Kidney disease Neg Hx   . Prostate cancer Neg Hx   . Kidney cancer Neg Hx     Social History   Socioeconomic History  . Marital status: Married    Spouse name: Not on file  . Number of children: Not on file  . Years of education: Not on file  . Highest education level: Not on file  Occupational History  . Not on file  Tobacco Use  .  Smoking status: Never Smoker  . Smokeless tobacco: Never Used  Vaping Use  . Vaping Use: Never used  Substance and Sexual Activity  . Alcohol use: Yes    Comment: RARE  . Drug use: Never  . Sexual activity: Yes    Birth control/protection: None    Comment: Partner has vasectomy   Other Topics Concern  . Not on file  Social History Narrative   ** Merged History Encounter **       Social Determinants of Health   Financial Resource Strain:   . Difficulty of Paying Living Expenses: Not on file  Food Insecurity:   . Worried About Charity fundraiser in the Last Year: Not on file  . Ran Out of Food in the Last Year: Not on file  Transportation Needs:   . Lack of Transportation (Medical): Not on file  . Lack of Transportation (Non-Medical): Not on file  Physical  Activity:   . Days of Exercise per Week: Not on file  . Minutes of Exercise per Session: Not on file  Stress:   . Feeling of Stress : Not on file  Social Connections:   . Frequency of Communication with Friends and Family: Not on file  . Frequency of Social Gatherings with Friends and Family: Not on file  . Attends Religious Services: Not on file  . Active Member of Clubs or Organizations: Not on file  . Attends Archivist Meetings: Not on file  . Marital Status: Not on file  Intimate Partner Violence:   . Fear of Current or Ex-Partner: Not on file  . Emotionally Abused: Not on file  . Physically Abused: Not on file  . Sexually Abused: Not on file    No current outpatient medications on file prior to visit.   No current facility-administered medications on file prior to visit.    No Known Allergies   Review of Systems Constitutional: negative for chills, fatigue, fevers and sweats Eyes: negative for irritation, redness and visual disturbance Ears, nose, mouth, throat, and face: negative for hearing loss, nasal congestion, snoring and tinnitus Respiratory: negative for asthma, cough, sputum Cardiovascular: negative for chest pain, dyspnea, exertional chest pressure/discomfort, irregular heart beat, palpitations and syncope Gastrointestinal: negative for abdominal pain, change in bowel habits, nausea and vomiting Genitourinary:  Negative for dysuria, genital lesions, sexual problems and vaginal discharge, and urinary incontinence. Positive for irregular cycles Integument/breast: negative for breast lump, breast tenderness and nipple discharge Hematologic/lymphatic: negative for bleeding and easy bruising Musculoskeletal:positive for back pain. Negative for muscle weakness Neurological: negative for dizziness, headaches, vertigo and weakness Endocrine: negative for diabetic symptoms including polydipsia, polyuria and skin dryness Allergic/Immunologic: negative for hay  fever and urticaria     Objective:  Blood pressure 129/77, pulse 85, height 5' 8"  (1.727 m), weight 147 lb 14.4 oz (67.1 kg), last menstrual period 10/16/2019. Body mass index is 22.49 kg/m.  General Appearance:    Alert, cooperative, no distress, appears stated age  Head:    Normocephalic, without obvious abnormality, atraumatic  Eyes:    PERRL, conjunctiva/corneas clear, EOM's intact, both eyes  Ears:    Normal external ear canals, both ears  Nose:   Nares normal, septum midline, mucosa normal, no drainage or sinus tenderness  Throat:   Lips, mucosa, and tongue normal; teeth and gums normal  Neck:   Supple, symmetrical, trachea midline, no adenopathy; thyroid: no enlargement/tenderness/nodules; no carotid bruit or JVD  Lungs:     Clear to auscultation bilaterally,  respirations unlabored  Chest Wall:    No tenderness or deformity   Heart:    Regular rate and rhythm, S1 and S2 normal, no murmur, rub or gallop  Breast Exam:    No tenderness, masses, or nipple abnormality  Abdomen:     Soft, non-tender, bowel sounds active all four quadrants, no masses, no organomegaly.    Genitalia:    Pelvic:external genitalia normal, vagina without lesions, discharge, or tenderness, rectovaginal septum  normal. Cervix normal in appearance, no cervical motion tenderness, no adnexal masses or tenderness.  Uterus normal size, shape, mobile, regular contours, nontender.  Rectal:    Normal external sphincter.  No hemorrhoids appreciated. Internal exam not done.   Extremities:   Extremities normal, atraumatic, no cyanosis or edema  Pulses:   2+ and symmetric all extremities  Skin:   Skin color, texture, turgor normal, no rashes or lesions  Lymph nodes:   Cervical, supraclavicular, and axillary nodes normal  Neurologic:   CNII-XII intact, normal strength, sensation and reflexes throughout   .  Labs:  Lab Results  Component Value Date   WBC 4.6 10/20/2018   HGB 12.7 10/20/2018   HCT 38.6 10/20/2018   MCV  89 10/20/2018   PLT 186 10/20/2018    Lab Results  Component Value Date   CREATININE 0.88 10/20/2018   BUN 10 10/20/2018   NA 139 10/20/2018   K 4.1 10/20/2018   CL 99 10/20/2018   CO2 26 10/20/2018    Lab Results  Component Value Date   ALT 22 10/20/2018   AST 22 10/20/2018   ALKPHOS 60 10/20/2018   BILITOT 0.4 10/20/2018    Lab Results  Component Value Date   TSH 0.977 04/01/2017      Assessment:   Routine gynecologic exam. Irregular cycles Fertility counseling History of scoliosis  Plan:  Blood tests: CBC, CMP, TSH, anovulation evaluation performed.  Breast self exam technique reviewed and patient encouraged to perform self-exam monthly. Contraception: none. Currently trying to conceive.  Pap smear up to date.   Discussed healthy lifestyle modifications. Declines flu vaccine.  Fertility counseling given, advised to have partner evaluated as he had no follow up after vasectomy reversal. Also discussed anovulation and potential causes. Labs performed today. Patient may require assistance with pregnancy, including Provera to regulate cycles and possibly even Clomid for ovulation induction. Should initiate PNV if she has not already.  Desires referral to chiropractor for h/o scoliosis and back pain. Referral placed.  Follow up in 1 year for annual exam. Will f/u sooner for further management of irregular cycles and suspected anovulation based on labs.    Rubie Maid, MD [facility]

## 2019-10-27 ENCOUNTER — Telehealth: Payer: Self-pay

## 2019-10-27 NOTE — Telephone Encounter (Signed)
Patient called in stating that she was supposed to have a prescription called into her pharmacy however she went to the pharmacy and they are stating that they don't have anything for this patient. When I asked the patient what it was called or what it was for she stated that she didn't know as she's never taken it before. Could you please advise?

## 2019-10-28 LAB — COMPREHENSIVE METABOLIC PANEL
ALT: 7 IU/L (ref 0–32)
AST: 14 IU/L (ref 0–40)
Albumin/Globulin Ratio: 2.2 (ref 1.2–2.2)
Albumin: 4.6 g/dL (ref 3.9–5.0)
Alkaline Phosphatase: 53 IU/L (ref 44–121)
BUN/Creatinine Ratio: 13 (ref 9–23)
BUN: 10 mg/dL (ref 6–20)
Bilirubin Total: 0.3 mg/dL (ref 0.0–1.2)
CO2: 25 mmol/L (ref 20–29)
Calcium: 9.2 mg/dL (ref 8.7–10.2)
Chloride: 102 mmol/L (ref 96–106)
Creatinine, Ser: 0.8 mg/dL (ref 0.57–1.00)
GFR calc Af Amer: 116 mL/min/{1.73_m2} (ref >59)
GFR calc non Af Amer: 101 mL/min/{1.73_m2} (ref >59)
Globulin, Total: 2.1 g/dL (ref 1.5–4.5)
Glucose: 82 mg/dL (ref 65–99)
Potassium: 4.4 mmol/L (ref 3.5–5.2)
Sodium: 138 mmol/L (ref 134–144)
Total Protein: 6.7 g/dL (ref 6.0–8.5)

## 2019-10-28 LAB — TESTOSTERONE, FREE, TOTAL, SHBG
Sex Hormone Binding: 54.2 nmol/L (ref 24.6–122.0)
Testosterone, Free: 9 pg/mL — ABNORMAL HIGH (ref 0.0–4.2)
Testosterone: 25 ng/dL (ref 13–71)

## 2019-10-28 LAB — FSH/LH
FSH: 5.3 m[IU]/mL
LH: 15 m[IU]/mL

## 2019-10-28 LAB — CBC
Hematocrit: 38 % (ref 34.0–46.6)
Hemoglobin: 12.9 g/dL (ref 11.1–15.9)
MCH: 31 pg (ref 26.6–33.0)
MCHC: 33.9 g/dL (ref 31.5–35.7)
MCV: 91 fL (ref 79–97)
Platelets: 208 10*3/uL (ref 150–450)
RBC: 4.16 x10E6/uL (ref 3.77–5.28)
RDW: 12.3 % (ref 11.7–15.4)
WBC: 6 10*3/uL (ref 3.4–10.8)

## 2019-10-28 LAB — PROGESTERONE: Progesterone: 0.1 ng/mL

## 2019-10-28 LAB — DHEA-SULFATE: DHEA-SO4: 235 ug/dL (ref 84.8–378.0)

## 2019-10-28 LAB — PROLACTIN: Prolactin: 9.3 ng/mL (ref 4.8–23.3)

## 2019-10-28 LAB — TSH: TSH: 0.798 u[IU]/mL (ref 0.450–4.500)

## 2019-10-28 LAB — ESTRADIOL: Estradiol: 67.4 pg/mL

## 2019-10-30 NOTE — Telephone Encounter (Signed)
Pt called no answer LM via VM. LM informing pt that I was calling to see which medication she did not receive after her visit. Will send pt a mychart message.

## 2019-10-31 ENCOUNTER — Other Ambulatory Visit: Payer: Self-pay | Admitting: Urology

## 2019-10-31 ENCOUNTER — Other Ambulatory Visit: Payer: Self-pay

## 2019-10-31 MED ORDER — MEDROXYPROGESTERONE ACETATE 10 MG PO TABS: 10.0000 mg | ORAL_TABLET | Freq: Every day | ORAL | 6 refills | Status: DC

## 2020-03-12 ENCOUNTER — Other Ambulatory Visit: Payer: Self-pay

## 2020-03-12 ENCOUNTER — Ambulatory Visit (INDEPENDENT_AMBULATORY_CARE_PROVIDER_SITE_OTHER): Payer: No Typology Code available for payment source | Admitting: Obstetrics and Gynecology

## 2020-03-12 ENCOUNTER — Encounter: Payer: Self-pay | Admitting: Obstetrics and Gynecology

## 2020-03-12 VITALS — BP 113/75 | HR 80 | Ht 68.0 in | Wt 147.8 lb

## 2020-03-12 DIAGNOSIS — N970 Female infertility associated with anovulation: Secondary | ICD-10-CM

## 2020-03-12 NOTE — Progress Notes (Signed)
Pt present today to discuss medication to help with conceiving.

## 2020-03-12 NOTE — Patient Instructions (Signed)
CLOMID PATIENT INSTRUCTIONS  WHY USE IT? Clomid helps your ovaries to release eggs (ovulate).  HOW TO USE IT? Clomid is taken as a pill usually on days 5,6,7,8, & 9 of your cycle.  Day 1 is the first day of your period. The dose or duration may be changed to achieve ovulation.  Provera (progesterone) may first be used to bring on a period for some patients.   If you do not get a period, take Provera 10 mg daily for 10 days to bring on a period; the first day you get bleeding is Day 1 of your cycle. The day of ovulation on Clomid is usually between cycle day 14 and 17.  Having sexual intercourse at least every other day between cycle day 13 and 18 will improve your chances of becoming pregnant during the Clomid cycle.  You may monitor your ovulation using basal body temperature charts or with ovulation kits.  If using the ovulation predictor kits, having intercourse the day of the surge and the two days following is recommended. If you get your period, call when it starts for an appointment with your doctor, so that an exam may be done, and another Clomid cycle can be considered if appropriate. If you do not get a period by day 35 of the cycle, please get a blood pregnancy test.  If it is negative, speak to your doctor for instructions to bring on another period and to plan a follow-up appointment.  THINGS TO KNOW: If you get pregnant while using Clomid, your chance of twins is 7% and triplets is less than 1%. Some studies have suggested the use of "fertility drugs" may increase your risk of ovarian cancers in the future.  It is unclear if these drugs increase the risk, or people who have problems with fertility are prone for these cancers.  If there is an actual risk, it is very low.  If you have a history of liver problems or ovarian cancer, it may be wise to avoid this medication.  SIDE EFFECTS:  The most common side effect is hot flashes (20%).  Breast tenderness, headaches, nausea, bloating may  also occur at different times.  Less than 3/1,000 people have dryness or loss of hair.  Persistent ovarian cysts may form from the use of this medication.  Ovarian hyperstimulation syndrome is a rare side effect at low doses.  Visual changes like flashes of light or blurring.     Female Infertility  Female infertility refers to a woman's inability to get pregnant (conceive) after a year of having sex regularly (or after 6 months in women over age 47) without using birth control. Infertility can also mean that a woman is not able to carry a pregnancy to full term. Both women and men can have fertility problems. What are the causes? This condition may be caused by:  Problems with reproductive organs. Infertility can result if a woman: ? Has an abnormally short cervix or a cervix that does not remain closed during a pregnancy. ? Has a blockage or scarring in the fallopian tubes. ? Has an abnormally shaped uterus. ? Has uterine fibroids. This is a benign mass of tissue or muscle (tumor) that can develop in the uterus. ? Is not ovulating in a regular way.  Certain medical conditions. These may include: ? Polycystic ovary syndrome (PCOS). This is a hormonal disorder that can cause small cysts to grow on the ovaries. This is the most common cause of infertility in women. ?  Endometriosis. This is a condition in which the tissue that lines the uterus (endometrium) grows outside of its normal location. ? Cancer and cancer treatments, such as chemotherapy or radiation. ? Premature ovarian failure. This is when ovaries stop producing eggs and hormones before age 55. ? Sexually transmitted diseases, such as chlamydia or gonorrhea. ? Autoimmune disorders. These are disorders in which the body's defense system (immune system) attacks normal, healthy cells. Infertility can be linked to more than one cause. For some women, the cause of infertility is not known (unexplained infertility). What  increases the risk?  Age. A woman's fertility declines with age, especially after her mid-35s.  Being underweight or overweight.  Drinking too much alcohol.  Using drugs such as anabolic steroids, cocaine, and marijuana.  Exercising excessively.  Being exposed to environmental toxins, such as radiation, pesticides, and certain chemicals. What are the signs or symptoms? The main sign of infertility in women is the inability to get pregnant or carry a pregnancy to full term. How is this diagnosed? This condition may be diagnosed by:  Checking whether you are ovulating each month. The tests may include: ? Blood tests to check hormone levels. ? An ultrasound of the ovaries. ? Taking a small tissue that lines the uterus and checking it under a microscope (endometrial biopsy).  Doing additional tests. This is done if ovulation is normal. Tests may include: ? Hysterosalpingography. This X-ray test can show the shape of the uterus and whether the fallopian tubes are open. ? Laparoscopy. This test uses a lighted tube (laparoscope) to look for problems in the fallopian tubes and other organs. ? Transvaginal ultrasound. This imaging test is used to check for abnormalities in the uterus and ovaries. ? Hysteroscopy. This test uses a lighted tube to check for problems in the cervix and the uterus. To be diagnosed with infertility, both partners will have a physical exam. Both partners will also have an extensive medical and sexual history taken. Additional tests may be done. How is this treated? Treatment depends on the cause of infertility. Most cases of infertility in women are treated with medicine or surgery.  Women may take medicine to: ? Correct ovulation problems. ? Treat other health conditions.  Surgery may be done to: ? Repair damage to the ovaries, fallopian tubes, cervix, or uterus. ? Remove growths from the uterus. ? Remove scar tissue from the uterus, pelvis, or other  organs. Assisted reproductive technology (ART) Assisted reproductive technology (ART) refers to all treatments and procedures that combine eggs and sperm outside the body to try to help a couple conceive. ART is often combined with fertility drugs to stimulate ovulation. Sometimes ART is done using eggs retrieved from another woman's body (donor eggs) or from previously frozen fertilized eggs (embryos). There are different types of ART. These include:  Intrauterine insemination (IUI). A long, thin tube is used to place sperm directly into a woman's uterus. This procedure: ? Is effective for infertility caused by sperm problems, including low sperm count and low motility. ? Can be used in combination with fertility drugs.  In vitro fertilization (IVF). This is done when a woman's fallopian tubes are blocked or when a man has low sperm count. In this procedure: ? Fertility drugs are used to stimulate the ovaries to produce multiple eggs. ? Once mature, these eggs are removed from the body and combined with the sperm to be fertilized. ? The fertilized eggs are then placed into the woman's uterus. Follow these instructions at home:  Take over-the-counter and prescription medicines only as told by your health care provider.  Do not use any products that contain nicotine or tobacco, such as cigarettes and e-cigarettes. If you need help quitting, ask your health care provider.  If you drink alcohol, limit how much you have to 1 drink a day.  Make dietary changes to lose weight or maintain a healthy weight. Work with your health care provider and a dietitian to set a weight-loss goal that is healthy and reasonable for you.  Seek support from a counselor or support group to talk about your concerns related to infertility. Couples counseling may be helpful for you and your partner.  Practice stress reduction techniques that work well for you, such as regular physical activity, meditation, or deep  breathing.  Keep all follow-up visits as told by your health care provider. This is important. Contact a health care provider if you:  Feel that stress is interfering with your life and relationships.  Have side effects from treatments for infertility. Summary  Female infertility refers to a woman's inability to get pregnant (conceive) after a year of having sex regularly (or after 6 months in women over age 22) without using birth control.  To be diagnosed with infertility, both partners will have a physical exam. Both partners will also have an extensive medical and sexual history taken.  Seek support from a counselor or support group to talk about your concerns related to infertility. Couples counseling may be helpful for you and your partner. This information is not intended to replace advice given to you by your health care provider. Make sure you discuss any questions you have with your health care provider. Document Revised: 10/12/2019 Document Reviewed: 09/01/2019 Elsevier Patient Education  [place].

## 2020-03-12 NOTE — Progress Notes (Signed)
    GYNECOLOGY PROGRESS NOTE  Subjective:    Patient ID: Monica Price, female    DOB: 1990-04-14, 30 y.o.   MRN: [id number]  HPI  Patient is a 30 y.o. G0P0000 female who presents for discussion of infertility. She notes that she and her husband have been having unprotected sex since last July.  Her cycles are still slightly irregular, however have improved since October when she was started on Provera. Cycles are occurring monthly, however may be as long as 35-37 days.  Also noting an episode of bleeding that lasts for 3-4 days the week prior to her cycle, usually lighter than her regular period. Had been taking ovulation kit tests but would not note any positive tests.  Of note, patient's husband has h/o vasectomy reversal, but has never had follow up after procedure.   The following portions of the patient's history were reviewed and updated as appropriate: allergies, current medications, past family history, past medical history, past social history, past surgical history and problem list.  Review of Systems Pertinent items noted in HPI and remainder of comprehensive ROS otherwise negative.   Objective:   Blood pressure 113/75, pulse 80, height $RemoveBe'5\' 8"'KLRXOPNLL$  (1.727 m), weight 147 lb 12.8 oz (67 kg), last menstrual period 03/06/2020. General appearance: alert and no distress Remainder of exam deferred.   Assessment:   Infertility, anovulatory Vasectomy reversal of partner  Plan:   - Patient with anovulatory bleeding, currently on Provera for management of cycles.  Had labs to rule out PCOS, does have elevated testosterone levels.  Will likely need ovulation induction. Also has not had an ultrasound in many years.  Will order sonohystogram to evaluate uterus and tubes. Discussed Clomid induction. Given instructions on use. Will begin in April.  - Advised that patient needs to have husband tested. Given information on semen analysis.    A total of 15 minutes were spent face-to-face  with the patient during this encounter and over half of that time dealt with counseling and coordination of care.  Rubie Maid, MD [facility]

## 2020-03-14 ENCOUNTER — Telehealth: Payer: Self-pay | Admitting: Obstetrics and Gynecology

## 2020-03-14 MED ORDER — CLOMIPHENE CITRATE 50 MG PO TABS: 50.0000 mg | ORAL_TABLET | Freq: Every day | ORAL | 3 refills | Status: DC

## 2020-03-14 NOTE — Telephone Encounter (Signed)
error 

## 2020-03-18 ENCOUNTER — Other Ambulatory Visit: Payer: Self-pay

## 2020-03-18 ENCOUNTER — Ambulatory Visit
Admission: RE | Admit: 2020-03-18 | Discharge: 2020-03-18 | Disposition: A | Discharge status: Home / Self Care | Payer: No Typology Code available for payment source | Source: Ambulatory Visit | Attending: Obstetrics and Gynecology | Admitting: Obstetrics and Gynecology

## 2020-03-18 DIAGNOSIS — N970 Female infertility associated with anovulation: Secondary | ICD-10-CM | POA: Insufficient documentation

## 2020-03-18 DIAGNOSIS — N979 Female infertility, unspecified: Secondary | ICD-10-CM

## 2020-03-18 MED ORDER — IOHEXOL 300 MG/ML  SOLN
40.0000 mL | Freq: Once | INTRAMUSCULAR | Status: AC | PRN
  Administered 2020-03-18: 40 mL

## 2020-03-18 NOTE — Procedures (Signed)
Hysterosalpingogram Post-Procedure Note  Pre-procedure Diagnosis: Primary Infertility, female; history of irregular cycles  Post-operative Diagnosis: Same  Indications: Infertility   Procedure Details:   Consent: Informed consent was obtained. Risks of the procedure were discussed including: infection, bleeding, pain, and allergic reaction to dye.  A speculum was inserted into the patient's vagina.  The cervix was cleansed with Betadine and a single-toothed tenaculum was used to grasp the anterior lip of the cervix.  The cervical opening was cannulated per standard procedure.  Then under fluoroscopic guidance, water soluble contrast was injected in a retrograde fashion. Initially spillage was not noted and the return of the solution from the cervix was moderate, so the catheter was adjusted and the balloon was refilled. The contrast was again injected in a retrograde fashion. Spillage was noted from bilateral tubes after approximately 4-5 seconds.  X-ray imaging captured per the Radiologist (Dr. Posey Pronto). The cannula was then removed from the uterine cavity.  The tenaculum was removed from the cervix with good hemostasis noted, and the speculum was removed.  The patient tolerated the procedure well.    Findings:   The uterus fills normally, with no evidence of contour abnormality, filling defect, septum, mass, or bicornuate configuration.   The left fallopian tube is normal and patent with normal rapid spillage of contrast into the peritoneum.   The right fallopian tube is normal and patent with normal rapid spillage of contrast into the peritoneum.   Fluoroscopy time: 1.5 minute.   Complications: None     Condition: Stable  Plan: Patient to f/u in clinic in as scheduled for further management of infertility.     Rubie Maid, MD [facility]

## 2020-05-07 ENCOUNTER — Telehealth: Payer: Self-pay

## 2020-05-07 NOTE — Telephone Encounter (Signed)
Pt called no answer VM box was full unable to leave a message will send the pt a mychart message.    Reason for call: Received a [facility] fax stating that they needed her husband's insurance and address information.

## 2020-05-15 ENCOUNTER — Telehealth: Payer: Self-pay | Admitting: Nurse Practitioner

## 2020-05-15 DIAGNOSIS — R059 Cough, unspecified: Secondary | ICD-10-CM

## 2020-05-15 MED ORDER — AZITHROMYCIN 250 MG PO TABS: ORAL_TABLET | ORAL | 0 refills | Status: DC

## 2020-05-15 MED ORDER — ALBUTEROL SULFATE HFA 108 (90 BASE) MCG/ACT IN AERS: 2.0000 | INHALATION_SPRAY | Freq: Four times a day (QID) | RESPIRATORY_TRACT | 0 refills | Status: DC | PRN

## 2020-05-15 MED ORDER — BENZONATATE 100 MG PO CAPS: 100.0000 mg | ORAL_CAPSULE | Freq: Three times a day (TID) | ORAL | 0 refills | Status: DC | PRN

## 2020-05-15 MED ORDER — PREDNISONE 10 MG (21) PO TBPK: ORAL_TABLET | ORAL | 0 refills | Status: DC

## 2020-05-15 NOTE — Progress Notes (Signed)
We are sorry that you are not feeling well.  Here is how we plan to help!  Based on your presentation I believe you most likely have A cough due to bacteria.  When patients have a fever and a productive cough with a change in color or increased sputum production, we are concerned about bacterial bronchitis.  If left untreated it can progress to pneumonia.  If your symptoms do not improve with your treatment plan it is important that you contact your provider.   I have prescribed Azithromyin 250 mg: two tablets now and then one tablet daily for 4 additonal days    In addition you may use A prescription cough medication called Tessalon Perles 100mg . You may take 1-2 capsules every 8 hours as needed for your cough.  Prednisone 10 mg daily for 6 days (see taper instructions below)  Directions for 6 day taper: Day 1: 2 tablets before breakfast, 1 after both lunch & dinner and 2 at bedtime Day 2: 1 tab before breakfast, 1 after both lunch & dinner and 2 at bedtime Day 3: 1 tab at each meal & 1 at bedtime Day 4: 1 tab at breakfast, 1 at lunch, 1 at bedtime Day 5: 1 tab at breakfast & 1 tab at bedtime Day 6: 1 tab at breakfast  albuterl inhaler- 2 puffs q4 as needed  From your responses in the eVisit questionnaire you describe inflammation in the upper respiratory tract which is causing a significant cough.  This is commonly called Bronchitis and has four common causes:    Allergies  Viral Infections  Acid Reflux  Bacterial Infection Allergies, viruses and acid reflux are treated by controlling symptoms or eliminating the cause. An example might be a cough caused by taking certain blood pressure medications. You stop the cough by changing the medication. Another example might be a cough caused by acid reflux. Controlling the reflux helps control the cough.  USE OF BRONCHODILATOR ("RESCUE") INHALERS: There is a risk from using your bronchodilator too frequently.  The risk is that over-reliance  on a medication which only relaxes the muscles surrounding the breathing tubes can reduce the effectiveness of medications prescribed to reduce swelling and congestion of the tubes themselves.  Although you feel brief relief from the bronchodilator inhaler, your asthma may actually be worsening with the tubes becoming more swollen and filled with mucus.  This can delay other crucial treatments, such as oral steroid medications. If you need to use a bronchodilator inhaler daily, several times per day, you should discuss this with your provider.  There are probably better treatments that could be used to keep your asthma under control.     HOME CARE . Only take medications as instructed by your medical team. . Complete the entire course of an antibiotic. . Drink plenty of fluids and get plenty of rest. . Avoid close contacts especially the very young and the elderly . Cover your mouth if you cough or cough into your sleeve. . Always remember to wash your hands . A steam or ultrasonic humidifier can help congestion.   GET HELP RIGHT AWAY IF: . You develop worsening fever. . You become short of breath . You cough up blood. . Your symptoms persist after you have completed your treatment plan MAKE SURE YOU   Understand these instructions.  Will watch your condition.  Will get help right away if you are not doing well or get worse.  Your e-visit answers were reviewed by a board  certified advanced clinical practitioner to complete your personal care plan.  Depending on the condition, your plan could have included both over the counter or prescription medications. If there is a problem please reply  once you have received a response from your provider. Your safety is important to [place].  If you have drug allergies check your prescription carefully.    You can use MyChart to ask questions about today's visit, request a non-urgent call back, or ask for a work or school excuse for 24 hours related to  this e-Visit. If it has been greater than 24 hours you will need to follow up with your provider, or enter a new e-Visit to address those concerns. You will get an e-mail in the next two days asking about your experience.  I hope that your e-visit has been valuable and will speed your recovery. Thank you for using e-visits.  5-10 minutes spent reviewing and documenting in chart.

## 2020-05-16 ENCOUNTER — Telehealth: Payer: Self-pay

## 2020-05-16 NOTE — Telephone Encounter (Signed)
Called pt's husband due to trying to reach out to someone concerning [facility] sent a letter requesting insurance information from the husband. Lm for husband to please reach out to [facility] number given on vm [phone]. Will be mailing information to pts address.

## 2020-05-16 NOTE — Telephone Encounter (Signed)
Pt called no answer unable to LM due to VM is full. Was calling concerning receiving a [facility] letter needing her spouse insurance information.

## 2020-08-06 MED ORDER — CLOMIPHENE CITRATE 50 MG PO TABS: 100.0000 mg | ORAL_TABLET | Freq: Every day | ORAL | 1 refills | Status: DC

## 2020-09-10 ENCOUNTER — Other Ambulatory Visit: Payer: Self-pay

## 2020-09-10 DIAGNOSIS — Z3200 Encounter for pregnancy test, result unknown: Secondary | ICD-10-CM

## 2020-09-12 ENCOUNTER — Other Ambulatory Visit: Payer: Self-pay

## 2020-09-12 DIAGNOSIS — Z3200 Encounter for pregnancy test, result unknown: Secondary | ICD-10-CM

## 2020-09-13 LAB — HCG, SERUM, QUALITATIVE: hCG,Beta Subunit,Qual,Serum: POSITIVE m[IU]/mL — AB (ref <6)

## 2020-09-30 NOTE — Patient Instructions (Signed)

## 2020-10-01 ENCOUNTER — Other Ambulatory Visit: Payer: Self-pay

## 2020-10-01 ENCOUNTER — Ambulatory Visit (INDEPENDENT_AMBULATORY_CARE_PROVIDER_SITE_OTHER): Payer: Self-pay | Admitting: Obstetrics and Gynecology

## 2020-10-01 ENCOUNTER — Encounter: Payer: Self-pay | Admitting: Obstetrics and Gynecology

## 2020-10-01 VITALS — BP 106/69 | HR 82 | Ht 68.0 in | Wt 149.8 lb

## 2020-10-01 DIAGNOSIS — N970 Female infertility associated with anovulation: Secondary | ICD-10-CM

## 2020-10-01 DIAGNOSIS — N926 Irregular menstruation, unspecified: Secondary | ICD-10-CM

## 2020-10-01 DIAGNOSIS — Z3201 Encounter for pregnancy test, result positive: Secondary | ICD-10-CM

## 2020-10-01 LAB — POCT URINE PREGNANCY: Preg Test, Ur: POSITIVE — AB

## 2020-10-01 NOTE — Progress Notes (Signed)
Subjective:    Monica Price is a 30 y.o. female who presents for evaluation of amenorrhea. She believes she could be pregnant. Pregnancy is desired. Sexual Activity: single partner, contraception: none. Has been on ovulation induction medication Clomid for h/o infertility (anovulatory), did 4 cycles (most recent cycle was at 100 mg dosing). Current symptoms also include: breast tenderness, nausea, and positive home pregnancy test. Last period was normal.   Patient's last menstrual period was 08/06/2020 (exact date).   The following portions of the patient's history were reviewed and updated as appropriate: allergies, current medications, past family history, past medical history, past social history, past surgical history, and problem list.  Review of Systems Pertinent items noted in HPI and remainder of comprehensive ROS otherwise negative.     Objective:    BP 106/69   Pulse 82   Ht 5\' 8"  (1.727 m)   Wt 149 lb 12.8 oz (67.9 kg)   LMP 08/06/2020 (Exact Date)   BMI 22.78 kg/m  General: alert, no distress, and no acute distress    Lab Review Urine HCG: positive    Assessment:   Absence of menstruation.  H/o infertility Positive pregnancy test.   Plan:    Pregnancy Test: Positive: EDC: 05/13/2020, EGA 8.0 weeks. Briefly discussed pre-natal care options. Encouraged well-balanced diet, plenty of rest when needed, pre-natal vitamins daily and walking for exercise. Discussed self-help for nausea, avoiding OTC medications until consulting provider or pharmacist, other than Tylenol as needed, minimal caffeine (1-2 cups daily) and avoiding alcohol. She will schedule her initial OB visit in the next month NOB intake and dating ultrasound in 2 weeks. Feel free to call with any questions.    Monica Maid, MD [facility]

## 2020-10-15 ENCOUNTER — Ambulatory Visit (INDEPENDENT_AMBULATORY_CARE_PROVIDER_SITE_OTHER): Payer: Self-pay

## 2020-10-15 ENCOUNTER — Other Ambulatory Visit: Payer: Self-pay

## 2020-10-15 DIAGNOSIS — Z3201 Encounter for pregnancy test, result positive: Secondary | ICD-10-CM

## 2020-10-15 DIAGNOSIS — N926 Irregular menstruation, unspecified: Secondary | ICD-10-CM

## 2020-10-21 ENCOUNTER — Other Ambulatory Visit: Payer: Self-pay

## 2020-10-21 ENCOUNTER — Ambulatory Visit (INDEPENDENT_AMBULATORY_CARE_PROVIDER_SITE_OTHER): Payer: 59 | Admitting: Obstetrics and Gynecology

## 2020-10-21 VITALS — BP 133/82 | HR 108 | Ht 68.0 in | Wt 153.1 lb

## 2020-10-21 DIAGNOSIS — T7589XA Other specified effects of external causes, initial encounter: Secondary | ICD-10-CM

## 2020-10-21 DIAGNOSIS — Z202 Contact with and (suspected) exposure to infections with a predominantly sexual mode of transmission: Secondary | ICD-10-CM | POA: Diagnosis not present

## 2020-10-21 DIAGNOSIS — Z3401 Encounter for supervision of normal first pregnancy, first trimester: Secondary | ICD-10-CM | POA: Diagnosis not present

## 2020-10-21 DIAGNOSIS — O99611 Diseases of the digestive system complicating pregnancy, first trimester: Secondary | ICD-10-CM | POA: Diagnosis not present

## 2020-10-21 DIAGNOSIS — K5900 Constipation, unspecified: Secondary | ICD-10-CM

## 2020-10-21 DIAGNOSIS — O219 Vomiting of pregnancy, unspecified: Secondary | ICD-10-CM

## 2020-10-21 NOTE — Patient Instructions (Addendum)
WHAT OB PATIENTS CAN EXPECT  Confirmation of pregnancy and ultrasound ordered if medically indicated-[redacted] weeks gestation New OB (NOB) intake with nurse and New OB (NOB) labs- [redacted] weeks gestation New OB (NOB) physical examination with provider- 11/[redacted] weeks gestation Flu vaccine-[redacted] weeks gestation Anatomy scan-[redacted] weeks gestation Glucose tolerance test, blood work to test for anemia, T-dap vaccine-[redacted] weeks gestation Vaginal swabs/cultures-STD/Group B strep-[redacted] weeks gestation Appointments every 4 weeks until 28 weeks Every 2 weeks from 28 weeks until 36 weeks Weekly visits from 36 weeks until delivery  Morning Sickness Morning sickness is when you feel like you may vomit (feel nauseous) during pregnancy. Sometimes, you may vomit. Morning sickness most often happens in the morning, but it can also happen at any time of the day. Some women may have morning sickness that makes them vomit all the time. This is a more serious problem that needs treatment. What are the causes? The cause of this condition is not known. What increases the risk? You had vomiting or a feeling like you may vomit before your pregnancy. You had morning sickness in another pregnancy. You are pregnant with more than one baby, such as twins. What are the signs or symptoms? Feeling like you may vomit. Vomiting. How is this treated? Treatment is usually not needed for this condition. You may only need to change what you eat. In some cases, your doctor may give you some things to take for your condition. These include: Vitamin B6 supplements. Medicines to treat the feeling that you may vomit. Ginger. Follow these instructions at home: Medicines Take over-the-counter and prescription medicines only as told by your doctor. Do not take any medicines until you talk with your doctor about them first. Take multivitamins before you get pregnant. These can stop or lessen the symptoms of morning sickness. Eating and drinking Eat dry  toast or crackers before getting out of bed. Eat 5 or 6 small meals a day. Eat dry and bland foods like rice and baked potatoes. Do not eat greasy, fatty, or spicy foods. Have someone cook for you if the smell of food causes you to vomit or to feel like you may vomit. If you feel like you may vomit after taking prenatal vitamins, take them at night or with a snack. Eat protein foods when you need a snack. Nuts, yogurt, and cheese are good choices. Drink fluids throughout the day. Try ginger ale made with real ginger, ginger tea made from fresh grated ginger, or ginger candies. General instructions Do not smoke or use any products that contain nicotine or tobacco. If you need help quitting, ask your doctor. Use an air purifier to keep the air in your house free of smells. Get lots of fresh air. Try to avoid smells that make you feel sick. Try wearing an acupressure wristband. This is a wristband that is used to treat seasickness. Try a treatment called acupuncture. In this treatment, a doctor puts needles into certain areas of your body to make you feel better. Contact a doctor if: You need medicine to feel better. You feel dizzy or light-headed. You are losing weight. Get help right away if: The feeling that you may vomit will not go away, or you cannot stop vomiting. You faint. You have very bad pain in your belly. Summary Morning sickness is when you feel like you may vomit (feel nauseous) during pregnancy. You may feel sick in the morning, but you can feel this way at any time of the day. Making some  changes to what you eat may help your symptoms go away. This information is not intended to replace advice given to you by your health care provider. Make sure you discuss any questions you have with your health care provider. Document Revised: 08/14/2019 Document Reviewed: 07/24/2019 Elsevier Patient Education  2022 [place] of Pregnancy The first trimester of  pregnancy starts on the first day of your last menstrual period until the end of week 12. This is months 1 through 3 of pregnancy. A week after a sperm fertilizes an egg, the egg will implant into the wall of the uterus and begin to develop into a baby. By the end of 12 weeks, all the baby's organs will be formed and the baby will be 2-3 inches in size. Body changes during your first trimester Your body goes through many changes during pregnancy. The changes vary and generally return to normal after your baby is born. Physical changes You may gain or lose weight. Your breasts may begin to grow larger and become tender. The tissue that surrounds your nipples (areola) may become darker. Dark spots or blotches (chloasma or mask of pregnancy) may develop on your face. You may have changes in your hair. These can include thickening or thinning of your hair or changes in texture. Health changes You may feel nauseous, and you may vomit. You may have heartburn. You may develop headaches. You may develop constipation. Your gums may bleed and may be sensitive to brushing and flossing. Other changes You may tire easily. You may urinate more often. Your menstrual periods will stop. You may have a loss of appetite. You may develop cravings for certain kinds of food. You may have changes in your emotions from day to day. You may have more vivid and strange dreams. Follow these instructions at home: Medicines Follow your health care provider's instructions regarding medicine use. Specific medicines may be either safe or unsafe to take during pregnancy. Do not take any medicines unless told to by your health care provider. Take a prenatal vitamin that contains at least 600 micrograms (mcg) of folic acid. Eating and drinking Eat a healthy diet that includes fresh fruits and vegetables, whole grains, good sources of protein such as meat, eggs, or tofu, and low-fat dairy products. Avoid raw meat and  unpasteurized juice, milk, and cheese. These carry germs that can harm you and your baby. If you feel nauseous or you vomit: Eat 4 or 5 small meals a day instead of 3 large meals. Try eating a few soda crackers. Drink liquids between meals instead of during meals. You may need to take these actions to prevent or treat constipation: Drink enough fluid to keep your urine pale yellow. Eat foods that are high in fiber, such as beans, whole grains, and fresh fruits and vegetables. Limit foods that are high in fat and processed sugars, such as fried or sweet foods. Activity Exercise only as directed by your health care provider. Most people can continue their usual exercise routine during pregnancy. Try to exercise for 30 minutes at least 5 days a week. Stop exercising if you develop pain or cramping in the lower abdomen or lower back. Avoid exercising if it is very hot or humid or if you are at high altitude. Avoid heavy lifting. If you choose to, you may have sex unless your health care provider tells you not to. Relieving pain and discomfort Wear a good support bra to relieve breast tenderness. Rest with your legs elevated  if you have leg cramps or low back pain. If you develop bulging veins (varicose veins) in your legs: Wear support hose as told by your health care provider. Elevate your feet for 15 minutes, 3-4 times a day. Limit salt in your diet. Safety Wear your seat belt at all times when driving or riding in a car. Talk with your health care provider if someone is verbally or physically abusive to you. Talk with your health care provider if you are feeling sad or have thoughts of hurting yourself. Lifestyle Do not use hot tubs, steam rooms, or saunas. Do not douche. Do not use tampons or scented sanitary pads. Do not use herbal remedies, alcohol, illegal drugs, or medicines that are not approved by your health care provider. Chemicals in these products can harm your baby. Do not  use any products that contain nicotine or tobacco, such as cigarettes, e-cigarettes, and chewing tobacco. If you need help quitting, ask your health care provider. Avoid cat litter boxes and soil used by cats. These carry germs that can cause birth defects in the baby and possibly loss of the unborn baby (fetus) by miscarriage or stillbirth. General instructions During routine prenatal visits in the first trimester, your health care provider will do a physical exam, perform necessary tests, and ask you how things are going. Keep all follow-up visits. This is important. Ask for help if you have counseling or nutritional needs during pregnancy. Your health care provider can offer advice or refer you to specialists for help with various needs. Schedule a dentist appointment. At home, brush your teeth with a soft toothbrush. Floss gently. Write down your questions. Take them to your prenatal visits. Where to find more information American Pregnancy Association: americanpregnancy.[place] and Gynecologists: PoolDevices.com.pt Office on [employer]: KeywordPortfolios.com.br Contact a health care provider if you have: Dizziness. A fever. Mild pelvic cramps, pelvic pressure, or nagging pain in the abdominal area. Nausea, vomiting, or diarrhea that lasts for 24 hours or longer. A bad-smelling vaginal discharge. Pain when you urinate. Known exposure to a contagious illness, such as chickenpox, measles, Zika virus, HIV, or hepatitis. Get help right away if you have: Spotting or bleeding from your vagina. Severe abdominal cramping or pain. Shortness of breath or chest pain. Any kind of trauma, such as from a fall or a car crash. New or increased pain, swelling, or redness in an arm or leg. Summary The first trimester of pregnancy starts on the first day of your last menstrual period until the end of week 12 (months 1 through 3). Eating 4 or 5 small  meals a day rather than 3 large meals may help to relieve nausea and vomiting. Do not use any products that contain nicotine or tobacco, such as cigarettes, e-cigarettes, and chewing tobacco. If you need help quitting, ask your health care provider. Keep all follow-up visits. This is important. This information is not intended to replace advice given to you by your health care provider. Make sure you discuss any questions you have with your health care provider. Document Revised: 06/07/2019 Document Reviewed: 04/13/2019 Elsevier Patient Education  [place]. Common Medications Safe in Pregnancy  Acne:      Constipation:  Benzoyl Peroxide     Colace  Clindamycin      Dulcolax Suppository  Topica Erythromycin     Fibercon  Salicylic Acid      Metamucil         Miralax AVOID:  Senakot   Accutane    Cough:  Retin-A       Cough Drops  Tetracycline      Phenergan w/ Codeine if Rx  Minocycline      Robitussin (Plain & DM)  Antibiotics:     Crabs/Lice:  Ceclor       RID  Cephalosporins    AVOID:  E-Mycins      Kwell  Keflex  Macrobid/Macrodantin   Diarrhea:  Penicillin      Kao-Pectate  Zithromax      Imodium AD         PUSH FLUIDS AVOID:       Cipro     Fever:  Tetracycline      Tylenol (Regular or Extra  Minocycline       Strength)  Levaquin      Extra Strength-Do not          Exceed 8 tabs/24 hrs Caffeine:        <228m/day (equiv. To 1 cup of coffee or  approx. 3 12 oz sodas)         Gas: Cold/Hayfever:       Gas-X  Benadryl      Mylicon  Claritin       Phazyme  **Claritin-D        Chlor-Trimeton    Headaches:  Dimetapp      ASA-Free Excedrin  Drixoral-Non-Drowsy     Cold Compress  Mucinex (Guaifenasin)     Tylenol (Regular or Extra  Sudafed/Sudafed-12 Hour     Strength)  **Sudafed PE Pseudoephedrine   Tylenol Cold & Sinus     Vicks Vapor Rub  Zyrtec  **AVOID if Problems With Blood Pressure         Heartburn: Avoid lying down for at least 1 hour  after meals  Aciphex      Maalox     Rash:  Milk of Magnesia     Benadryl    Mylanta       1% Hydrocortisone Cream  Pepcid  Pepcid Complete   Sleep Aids:  Prevacid      Ambien   Prilosec       Benadryl  Rolaids       Chamomile Tea  Tums (Limit 4/day)     Unisom         Tylenol PM         Warm milk-add vanilla or  Hemorrhoids:       Sugar for taste  Anusol/Anusol H.C.  (RX: Analapram 2.5%)  Sugar Substitutes:  Hydrocortisone OTC     Ok in moderation  Preparation H      Tucks        Vaseline lotion applied to tissue with wiping    Herpes:     Throat:  Acyclovir      Oragel  Famvir  Valtrex     Vaccines:         Flu Shot Leg Cramps:       *Gardasil  Benadryl      Hepatitis A         Hepatitis B Nasal Spray:       Pneumovax  Saline Nasal Spray     Polio Booster         Tetanus Nausea:       Tuberculosis test or PPD  Vitamin B6 25 mg TID   AVOID:    Dramamine      *Gardasil  Emetrol  Live Poliovirus  Ginger Root 250 mg QID    MMR (measles, mumps &  High Complex Carbs @ Bedtime    rebella)  Sea Bands-Accupressure    Varicella (Chickenpox)  Unisom 1/2 tab TID     *No known complications           If received before Pain:         Known pregnancy;   Darvocet       Resume series after  Lortab        Delivery  Percocet    Yeast:   Tramadol      Femstat  Tylenol 3      Gyne-lotrimin  Ultram       Monistat  Vicodin           MISC:         All Sunscreens           Hair Coloring/highlights          Insect Repellant's          (Including DEET)         [facility]  [place], [place], [place] [address]  Phone: [phone]  [facility] (second location)  [address] [place], [place] [address]  Phone: [phone]  [facility]) [place], [address], [place] [address] Phone: [phone]  [place] [address] [place], [place] [address]  Phone:  [phone]

## 2020-10-21 NOTE — Progress Notes (Signed)
Monica Price presents for NOB nurse interview visit. Pregnancy confirmation done __9/20/2022 with [facility].  G- 1.  P- 0.  Dating scan on 10/4 shows [facility] at 23 6/7. Edd 05/14/2021. Pregnancy education material explained and given. __1_ cat in the home.  Pt advised to not change the litter box.  Toxo ordered. NOB labs ordered.  HIV labs and Drug screen were explained and ordered. PNV encouraged. Genetic screening options discussed. Genetic testing: to be done at nob pe.   Pt may discuss with provider.  Pt has been nauseous for the last several weeks but getting better. No meds needed at this time. Pt c/o of constipation. She has tried colace and miralax x 2 days with no relief. Protcol given. Encouraged her to take miralax daily. Advised  it may take 3-4 days for her to get relief. Push h20. Fresh fruits and veggies. Will f/u at NOB PE.  Last pap 10/08/2017 neg. Flu vaccine due at 16 weeks. Pt. To follow up with provider in _2 weeks for NOB physical.  All questions answered.

## 2020-10-22 LAB — RPR: RPR Ser Ql: NONREACTIVE

## 2020-10-22 LAB — ABO AND RH: Rh Factor: POSITIVE

## 2020-10-22 LAB — VIRAL HEPATITIS HBV, HCV
HCV Ab: <0.1 s/co ratio (ref 0.0–0.9)
Hep B Core Total Ab: NEGATIVE
Hep B Surface Ab, Qual: NONREACTIVE
Hepatitis B Surface Ag: NEGATIVE

## 2020-10-22 LAB — URINALYSIS, ROUTINE W REFLEX MICROSCOPIC
Bilirubin, UA: NEGATIVE
Glucose, UA: NEGATIVE
Ketones, UA: NEGATIVE
Nitrite, UA: NEGATIVE
RBC, UA: NEGATIVE
Specific Gravity, UA: 1.017 (ref 1.005–1.030)
Urobilinogen, Ur: 0.2 mg/dL (ref 0.2–1.0)
pH, UA: 7 (ref 5.0–7.5)

## 2020-10-22 LAB — MICROSCOPIC EXAMINATION
Casts: NONE SEEN /lpf
Epithelial Cells (non renal): >10 /hpf — AB (ref 0–10)

## 2020-10-22 LAB — RUBELLA SCREEN: Rubella Antibodies, IGG: 4.37 index (ref >0.99)

## 2020-10-22 LAB — VARICELLA ZOSTER ANTIBODY, IGG: Varicella zoster IgG: <135 index — ABNORMAL LOW (ref >165)

## 2020-10-22 LAB — ANTIBODY SCREEN: Antibody Screen: NEGATIVE

## 2020-10-22 LAB — HIV ANTIBODY (ROUTINE TESTING W REFLEX): HIV Screen 4th Generation wRfx: NONREACTIVE

## 2020-10-22 LAB — HCV INTERPRETATION

## 2020-10-23 LAB — PAIN MGT SCRN (14 DRUGS), UR
Amphetamine Scrn, Ur: NEGATIVE ng/mL
BARBITURATE SCREEN URINE: NEGATIVE ng/mL
BENZODIAZEPINE SCREEN, URINE: NEGATIVE ng/mL
Buprenorphine, Urine: NEGATIVE ng/mL
CANNABINOIDS UR QL SCN: NEGATIVE ng/mL
Cocaine (Metab) Scrn, Ur: NEGATIVE ng/mL
Creatinine(Crt), U: 133.4 mg/dL (ref 20.0–300.0)
Fentanyl, Urine: NEGATIVE pg/mL
Meperidine Screen, Urine: NEGATIVE ng/mL
Methadone Screen, Urine: NEGATIVE ng/mL
OXYCODONE+OXYMORPHONE UR QL SCN: NEGATIVE ng/mL
Opiate Scrn, Ur: NEGATIVE ng/mL
Ph of Urine: 7.2 (ref 4.5–8.9)
Phencyclidine Qn, Ur: NEGATIVE ng/mL
Propoxyphene Scrn, Ur: NEGATIVE ng/mL
Tramadol Screen, Urine: NEGATIVE ng/mL

## 2020-10-23 LAB — CULTURE, OB URINE

## 2020-10-23 LAB — NICOTINE SCREEN, URINE: Cotinine Ql Scrn, Ur: NEGATIVE ng/mL

## 2020-10-23 LAB — URINE CULTURE, OB REFLEX

## 2020-10-25 LAB — GC/CHLAMYDIA PROBE AMP
Chlamydia trachomatis, NAA: NEGATIVE
Neisseria Gonorrhoeae by PCR: NEGATIVE

## 2020-10-29 ENCOUNTER — Ambulatory Visit (INDEPENDENT_AMBULATORY_CARE_PROVIDER_SITE_OTHER): Payer: 59 | Admitting: Obstetrics and Gynecology

## 2020-10-29 ENCOUNTER — Encounter: Payer: Self-pay | Admitting: Obstetrics and Gynecology

## 2020-10-29 ENCOUNTER — Other Ambulatory Visit: Payer: Self-pay

## 2020-10-29 ENCOUNTER — Other Ambulatory Visit (HOSPITAL_COMMUNITY)
Admission: RE | Admit: 2020-10-29 | Discharge: 2020-10-29 | Disposition: A | Discharge status: Home / Self Care | Payer: 59 | Source: Ambulatory Visit | Attending: Obstetrics and Gynecology | Admitting: Obstetrics and Gynecology

## 2020-10-29 VITALS — BP 103/68 | HR 82 | Ht 68.0 in | Wt 152.3 lb

## 2020-10-29 DIAGNOSIS — Z124 Encounter for screening for malignant neoplasm of cervix: Secondary | ICD-10-CM

## 2020-10-29 DIAGNOSIS — Z8742 Personal history of other diseases of the female genital tract: Secondary | ICD-10-CM

## 2020-10-29 DIAGNOSIS — Z2839 Other underimmunization status: Secondary | ICD-10-CM

## 2020-10-29 DIAGNOSIS — Z3A12 12 weeks gestation of pregnancy: Secondary | ICD-10-CM

## 2020-10-29 DIAGNOSIS — O09899 Supervision of other high risk pregnancies, unspecified trimester: Secondary | ICD-10-CM

## 2020-10-29 DIAGNOSIS — Z3401 Encounter for supervision of normal first pregnancy, first trimester: Secondary | ICD-10-CM

## 2020-10-29 DIAGNOSIS — Z2821 Immunization not carried out because of patient refusal: Secondary | ICD-10-CM

## 2020-10-29 LAB — POCT URINALYSIS DIPSTICK OB
Bilirubin, UA: NEGATIVE
Blood, UA: NEGATIVE
Glucose, UA: NEGATIVE
Ketones, UA: NEGATIVE
Nitrite, UA: NEGATIVE
Spec Grav, UA: 1.02 (ref 1.010–1.025)
Urobilinogen, UA: 0.2 E.U./dL
pH, UA: 6.5 (ref 5.0–8.0)

## 2020-10-29 NOTE — Patient Instructions (Signed)
WHAT OB PATIENTS CAN EXPECT  Confirmation of pregnancy and ultrasound ordered if medically indicated-[redacted] weeks gestation New OB (NOB) intake with nurse and New OB (NOB) labs- [redacted] weeks gestation New OB (NOB) physical examination with provider- 11/[redacted] weeks gestation Flu vaccine-[redacted] weeks gestation Anatomy scan-[redacted] weeks gestation Glucose tolerance test, blood work to test for anemia, T-dap vaccine-[redacted] weeks gestation Vaginal swabs/cultures-STD/Group B strep-[redacted] weeks gestation Appointments every 4 weeks until 28 weeks Every 2 weeks from 28 weeks until 36 weeks Weekly visits from 36 weeks until delivery     Second Trimester of Pregnancy The second trimester of pregnancy is from week 13 through week 27. This is months 4 through 6 of pregnancy. The second trimester is often a time when you feel your best. Your body has adjusted to being pregnant, and you begin to feel better physically. During the second trimester: Morning sickness has lessened or stopped completely. You may have more energy. You may have an increase in appetite. The second trimester is also a time when the unborn baby (fetus) is growing rapidly. At the end of the sixth month, the fetus may be up to 12 inches long and weigh about 1 pounds. You will likely begin to feel the baby move (quickening) between 16 and 20 weeks of pregnancy. Body changes during your second trimester Your body continues to go through many changes during your second trimester. The changes vary and generally return to normal after the baby is born. Physical changes Your weight will continue to increase. You will notice your lower abdomen bulging out. You may begin to get stretch marks on your hips, abdomen, and breasts. Your breasts will continue to grow and to become tender. Dark spots or blotches (chloasma or mask of pregnancy) may develop on your face. A dark line from your belly button to the pubic area (linea nigra) may appear. You may have changes in  your hair. These can include thickening of your hair, rapid growth, and changes in texture. Some people also have hair loss during or after pregnancy, or hair that feels dry or thin. Health changes You may develop headaches. You may have heartburn. You may develop constipation. You may develop hemorrhoids or swollen, bulging veins (varicose veins). Your gums may bleed and may be sensitive to brushing and flossing. You may urinate more often because the fetus is pressing on your bladder. You may have back pain. This is caused by: Weight gain. Pregnancy hormones that are relaxing the joints in your pelvis. A shift in weight and the muscles that support your balance. Follow these instructions at home: Medicines Follow your health care provider's instructions regarding medicine use. Specific medicines may be either safe or unsafe to take during pregnancy. Do not take any medicines unless approved by your health care provider. Take a prenatal vitamin that contains at least 600 micrograms (mcg) of folic acid. Eating and drinking Eat a healthy diet that includes fresh fruits and vegetables, whole grains, good sources of protein such as meat, eggs, or tofu, and low-fat dairy products. Avoid raw meat and unpasteurized juice, milk, and cheese. These carry germs that can harm you and your baby. You may need to take these actions to prevent or treat constipation: Drink enough fluid to keep your urine pale yellow. Eat foods that are high in fiber, such as beans, whole grains, and fresh fruits and vegetables. Limit foods that are high in fat and processed sugars, such as fried or sweet foods. Activity Exercise only as directed by  your health care provider. Most people can continue their usual exercise routine during pregnancy. Try to exercise for 30 minutes at least 5 days a week. Stop exercising if you develop contractions in your uterus. Stop exercising if you develop pain or cramping in the lower  abdomen or lower back. Avoid exercising if it is very hot or humid or if you are at a high altitude. Avoid heavy lifting. If you choose to, you may have sex unless your health care provider tells you not to. Relieving pain and discomfort Wear a supportive bra to prevent discomfort from breast tenderness. Take warm sitz baths to soothe any pain or discomfort caused by hemorrhoids. Use hemorrhoid cream if your health care provider approves. Rest with your legs raised (elevated) if you have leg cramps or low back pain. If you develop varicose veins: Wear support hose as told by your health care provider. Elevate your feet for 15 minutes, 3-4 times a day. Limit salt in your diet. Safety Wear your seat belt at all times when driving or riding in a car. Talk with your health care provider if someone is verbally or physically abusive to you. Lifestyle Do not use hot tubs, steam rooms, or saunas. Do not douche. Do not use tampons or scented sanitary pads. Avoid cat litter boxes and soil used by cats. These carry germs that can cause birth defects in the baby and possibly loss of the fetus by miscarriage or stillbirth. Do not use herbal remedies, alcohol, illegal drugs, or medicines that are not approved by your health care provider. Chemicals in these products can harm your baby. Do not use any products that contain nicotine or tobacco, such as cigarettes, e-cigarettes, and chewing tobacco. If you need help quitting, ask your health care provider. General instructions During a routine prenatal visit, your health care provider will do a physical exam and other tests. He or she will also discuss your overall health. Keep all follow-up visits. This is important. Ask your health care provider for a referral to a local prenatal education class. Ask for help if you have counseling or nutritional needs during pregnancy. Your health care provider can offer advice or refer you to specialists for help with  various needs. Where to find more information American Pregnancy Association: americanpregnancy.[place] and Gynecologists: PoolDevices.com.pt Office on [employer]: KeywordPortfolios.com.br Contact a health care provider if you have: A headache that does not go away when you take medicine. Vision changes or you see spots in front of your eyes. Mild pelvic cramps, pelvic pressure, or nagging pain in the abdominal area. Persistent nausea, vomiting, or diarrhea. A bad-smelling vaginal discharge or foul-smelling urine. Pain when you urinate. Sudden or extreme swelling of your face, hands, ankles, feet, or legs. A fever. Get help right away if you: Have fluid leaking from your vagina. Have spotting or bleeding from your vagina. Have severe abdominal cramping or pain. Have difficulty breathing. Have chest pain. Have fainting spells. Have not felt your baby move for the time period told by your health care provider. Have new or increased pain, swelling, or redness in an arm or leg. Summary The second trimester of pregnancy is from week 13 through week 27 (months 4 through 6). Do not use herbal remedies, alcohol, illegal drugs, or medicines that are not approved by your health care provider. Chemicals in these products can harm your baby. Exercise only as directed by your health care provider. Most people can continue their usual exercise routine  during pregnancy. Keep all follow-up visits. This is important. This information is not intended to replace advice given to you by your health care provider. Make sure you discuss any questions you have with your health care provider. Document Revised: 06/07/2019 Document Reviewed: 04/13/2019 Elsevier Patient Education  2022 [employer].

## 2020-10-29 NOTE — Progress Notes (Signed)
OBSTETRIC INITIAL PRENATAL VISIT  Subjective:    Monica Price is being seen today for her first obstetrical visit.  This is a planned pregnancy, conceived via Clomid induction for h/o primary anovulatory infertility She is a 30 y.o. G1P0000 female at [redacted]w[redacted]d gestation, Estimated Date of Delivery: 05/13/21 with Patient's last menstrual period was 08/06/2020 (exact date), consistent with 9 week sono. Her obstetrical history is significant for primary infertility . Relationship with FOB: spouse, living together. Patient does intend to breast feed. Pregnancy history fully reviewed.    OB History  Gravida Para Term Preterm AB Living  1 0 0 0 0 0  SAB IAB Ectopic Multiple Live Births  0 0 0 0 0    # Outcome Date GA Lbr Len/2nd Weight Sex Delivery Anes PTL Lv  1 Current             Gynecologic History:  Last pap smear was 10/08/2017.  Results were Normal.  Denies history of STIs.  Contraception prior to conception: Remote h/o OCP use   Past Medical History:  Diagnosis Date   Anemia    GERD (gastroesophageal reflux disease)    Interstitial cystitis    Microscopic hematuria    Recurrent UTI    Pt has been treated for kidney infection and UTI for 10 months    Family History  Problem Relation Age of Onset   Healthy Mother    Healthy Father    Bladder Cancer Maternal Aunt        great   Bladder Cancer Maternal Uncle        great   Kidney Stones Maternal Grandmother    Throat cancer Maternal Grandfather    Kidney disease Neg Hx    Prostate cancer Neg Hx    Kidney cancer Neg Hx    Ovarian cancer Neg Hx    Breast cancer Neg Hx     Past Surgical History:  Procedure Laterality Date   CYSTO WITH HYDRODISTENSION N/A 02/08/2018   Procedure: CYSTOSCOPY/HYDRODISTENSION;  Surgeon: Royston Cowper, MD;  Location: [facility] ORS;  Service: Urology;  Laterality: N/A;   WISDOM TOOTH EXTRACTION      Social History   Socioeconomic History   Marital status: Married    Spouse name: Not on  file   Number of children: Not on file   Years of education: Not on file   Highest education level: Not on file  Occupational History   Not on file  Tobacco Use   Smoking status: Never   Smokeless tobacco: Never  Vaping Use   Vaping Use: Never used  Substance and Sexual Activity   Alcohol use: Not Currently    Comment: RARE   Drug use: Never   Sexual activity: Yes    Birth control/protection: None  Other Topics Concern   Not on file  Social History Narrative   ** Merged History Encounter **       Social Determinants of Health   Financial Resource Strain: Not on file  Food Insecurity: Not on file  Transportation Needs: Not on file  Physical Activity: Not on file  Stress: Not on file  Social Connections: Not on file  Intimate Partner Violence: Not on file    Current Outpatient Medications on File Prior to Visit  Medication Sig Dispense Refill   Prenatal Vit-Fe Fumarate-FA (MULTIVITAMIN-PRENATAL) 27-0.8 MG TABS tablet Take 1 tablet by mouth daily at 12 noon.     No current facility-administered medications on file prior to visit.  No Known Allergies   Review of Systems General: Not Present- Fever, Weight Loss and Weight Gain. Skin: Not Present- Rash. HEENT: Not Present- Blurred Vision, Headache and Bleeding Gums. Respiratory: Not Present- Difficulty Breathing. Breast: Not Present- Breast Mass. Cardiovascular: Not Present- Chest Pain, Elevated Blood Pressure, Fainting / Blacking Out and Shortness of Breath. Gastrointestinal: Not Present- Abdominal Pain, Constipation, Nausea and Vomiting (improved). Female Genitourinary: Not Present- Frequency, Painful Urination, Pelvic Pain, Vaginal Bleeding, Vaginal Discharge, Contractions, regular, Fetal Movements Decreased, Urinary Complaints and Vaginal Fluid. Musculoskeletal: Not Present- Back Pain and Leg Cramps. Neurological: Not Present- Dizziness. Psychiatric: Not Present- Depression.     Objective:   Blood pressure  103/68, pulse 82, weight 152 lb 4.8 oz (69.1 kg), last menstrual period 08/06/2020.  Body mass index is 23.16 kg/m.  General Appearance:    Alert, cooperative, no distress, appears stated age  Head:    Normocephalic, without obvious abnormality, atraumatic  Eyes:    PERRL, conjunctiva/corneas clear, EOM's intact, both eyes  Ears:    Normal external ear canals, both ears  Nose:   Nares normal, septum midline, mucosa normal, no drainage or sinus tenderness  Throat:   Lips, mucosa, and tongue normal; teeth and gums normal  Neck:   Supple, symmetrical, trachea midline, no adenopathy; thyroid: no enlargement/tenderness/nodules; no carotid bruit or JVD  Back:     Symmetric, no curvature, ROM normal, no CVA tenderness  Lungs:     Clear to auscultation bilaterally, respirations unlabored  Chest Wall:    No tenderness or deformity   Heart:    Regular rate and rhythm, S1 and S2 normal, no murmur, rub or gallop  Breast Exam:    No tenderness, masses, or nipple abnormality  Abdomen:     Soft, non-tender, bowel sounds active all four quadrants, no masses, no organomegaly.  FHT 167 bpm.  Genitalia:    Pelvic:external genitalia normal, vagina without lesions, discharge, or tenderness, rectovaginal septum  normal. Cervix normal in appearance, no cervical motion tenderness, no adnexal masses or tenderness.  Pregnancy positive findings: uterine enlargement: 12 wk size, nontender.   Rectal:    Normal external sphincter.  No hemorrhoids appreciated. Internal exam not done.   Extremities:   Extremities normal, atraumatic, no cyanosis or edema  Pulses:   2+ and symmetric all extremities  Skin:   Skin color, texture, turgor normal, no rashes or lesions  Lymph nodes:   Cervical, supraclavicular, and axillary nodes normal  Neurologic:   CNII-XII intact, normal strength, sensation and reflexes throughout     Assessment:   1. Encounter for supervision of normal first pregnancy in first trimester   2. [redacted] weeks  gestation of pregnancy   3. Cervical cancer screening   4. History of infertility, female   53. Influenza vaccination declined     Plan:   Supervision of normal risk pregnancy at 12 weeks - Pregnancy conceived with Clomid - Initial labs reviewed. - Prenatal vitamins encouraged. - Problem list reviewed and updated. - New OB counseling:  The patient has been given an overview regarding routine prenatal care.  Recommendations regarding diet, weight gain, and exercise in pregnancy were given. - Prenatal testing, optional genetic testing, and ultrasound use in pregnancy were reviewed.  Traditional genetic screening vs cell-fee DNA genetic screening discussed, including risks and benefits. Testing ordered (Panorama and Horizon screening). - Benefits of Breast Feeding were discussed. The patient is encouraged to consider nursing her baby post partum. - Declined flu vaccine.    2. Cervical  cancer screening - Cytology - PAP    3. Varicella non-immune - Patient notes she was vaccinated as a child. Discussed that she may need a revaccination. Can perform postpartum.    Follow up in 4 weeks.    Rubie Maid, MD [facility]

## 2020-10-29 NOTE — Progress Notes (Signed)
ROB: She is doing well, no new concerns today. She said her mom said she had varicella vaccine as a child.

## 2020-10-30 LAB — CBC
Hematocrit: 35.4 % (ref 34.0–46.6)
Hemoglobin: 12.1 g/dL (ref 11.1–15.9)
MCH: 30.9 pg (ref 26.6–33.0)
MCHC: 34.2 g/dL (ref 31.5–35.7)
MCV: 91 fL (ref 79–97)
Platelets: 201 10*3/uL (ref 150–450)
RBC: 3.91 x10E6/uL (ref 3.77–5.28)
RDW: 13.5 % (ref 11.7–15.4)
WBC: 8.5 10*3/uL (ref 3.4–10.8)

## 2020-10-30 LAB — CYTOLOGY - PAP: Diagnosis: NEGATIVE

## 2020-11-26 ENCOUNTER — Other Ambulatory Visit: Payer: Self-pay

## 2020-11-26 ENCOUNTER — Ambulatory Visit (INDEPENDENT_AMBULATORY_CARE_PROVIDER_SITE_OTHER): Payer: 59 | Admitting: Obstetrics and Gynecology

## 2020-11-26 ENCOUNTER — Encounter: Payer: Self-pay | Admitting: Obstetrics and Gynecology

## 2020-11-26 VITALS — BP 111/72 | HR 85 | Wt 155.1 lb

## 2020-11-26 DIAGNOSIS — Z3A16 16 weeks gestation of pregnancy: Secondary | ICD-10-CM

## 2020-11-26 DIAGNOSIS — Z3402 Encounter for supervision of normal first pregnancy, second trimester: Secondary | ICD-10-CM

## 2020-11-26 LAB — POCT URINALYSIS DIPSTICK OB
Bilirubin, UA: NEGATIVE
Blood, UA: NEGATIVE
Glucose, UA: NEGATIVE
Ketones, UA: NEGATIVE
Nitrite, UA: NEGATIVE
Spec Grav, UA: 1.015 (ref 1.010–1.025)
Urobilinogen, UA: 0.2 E.U./dL
pH, UA: 7 (ref 5.0–8.0)

## 2020-11-26 NOTE — Progress Notes (Signed)
ROB: Denies problems.  Taking vitamins as directed.  Desires AFP today.  Anatomy ultrasound with next visit.

## 2020-11-26 NOTE — Progress Notes (Signed)
ROB: She is doing well, no new concerns. 

## 2020-11-29 LAB — AFP, SERUM, OPEN SPINA BIFIDA
AFP MoM: 1.03
AFP Value: 35.4 ng/mL
Gest. Age on Collection Date: 16 weeks
Maternal Age At EDD: 29.6 yr
OSBR Risk 1 IN: 10000
Test Results:: NEGATIVE
Weight: 155 [lb_av]

## 2020-12-24 ENCOUNTER — Other Ambulatory Visit: Payer: Self-pay

## 2020-12-24 ENCOUNTER — Ambulatory Visit (INDEPENDENT_AMBULATORY_CARE_PROVIDER_SITE_OTHER): Payer: 59 | Admitting: Obstetrics and Gynecology

## 2020-12-24 ENCOUNTER — Ambulatory Visit (INDEPENDENT_AMBULATORY_CARE_PROVIDER_SITE_OTHER): Payer: 59

## 2020-12-24 VITALS — BP 119/68 | HR 81 | Wt 164.6 lb

## 2020-12-24 DIAGNOSIS — Z3402 Encounter for supervision of normal first pregnancy, second trimester: Secondary | ICD-10-CM

## 2020-12-24 DIAGNOSIS — Z3A20 20 weeks gestation of pregnancy: Secondary | ICD-10-CM

## 2020-12-24 LAB — POCT URINALYSIS DIPSTICK OB
Bilirubin, UA: NEGATIVE
Blood, UA: NEGATIVE
Glucose, UA: NEGATIVE
Ketones, UA: NEGATIVE
Leukocytes, UA: NEGATIVE
Nitrite, UA: NEGATIVE
POC,PROTEIN,UA: NEGATIVE
Spec Grav, UA: 1.01 (ref 1.010–1.025)
Urobilinogen, UA: 0.2 E.U./dL
pH, UA: 6 (ref 5.0–8.0)

## 2020-12-24 NOTE — Progress Notes (Signed)
ROB: She is doing well, she has no new concerns today. 

## 2020-12-24 NOTE — Progress Notes (Signed)
ROB: Doing well, no complaints. S/p anatomy scan today.  Discussed plans for labor, given birth plan. RTC in 4 weeks.

## 2020-12-24 NOTE — Patient Instructions (Signed)

## 2021-01-12 NOTE — L&D Delivery Note (Signed)
Delivery Summary for Monica Price ? ?Labor Events:   ?Preterm labor: No data found  ?Rupture date: 05/02/2021  ?Rupture time: 10:36 AM  ?Rupture type: Spontaneous ?Intact  ?Fluid Color: Light Meconium  ?Induction: No data found  ?Augmentation: No data found  ?Complications: No data found  ?Cervical ripening: No data found No data found  ? No data found  ?   ?Delivery:   ?Episiotomy: No data found  ?Lacerations: No data found  ?Repair suture: No data found  ?Repair # of packets: No data found  ?Blood loss (ml): No data found  ? ?Information for the patient's newborn:  Dowers, [place] [[id number]]  ? ?Delivery ?05/02/2021 12:45 PM by  Vaginal, Spontaneous ?Sex:  female Gestational Age: [redacted]w[redacted]d?Delivery Clinician:   ?Living?:  ? ?      APGARS  One minute Five minutes Ten minutes  ?Skin color:        ?Heart rate:        ?Grimace:        ?Muscle tone:        ?Breathing:        ?Totals: 6  9     ? ?Presentation/position:      ?Resuscitation:   ?Cord information:    Disposition of cord blood:     Blood gases sent?  ?Complications:   ?Placenta: Delivered:       appearance ?Newborn Measurements: ?Weight: 7 lb 3.3 oz (3270 g)  Height: 20"  Head circumference:    Chest circumference:    ?Other providers:    ?Additional  information: ?Forceps:   ?Vacuum:   ?Breech:   ?Observed anomalies   ? ? ? ? ?Delivery Note ?At 12:45 PM a viable and healthy female was delivered via Vaginal, Spontaneous (Presentation:  Vertex; LOA position, compound).  APGAR: 6, 9; weight  3270 grams.   ?Placenta status: Spontaneous, Intact.  Cord: 3 vessels with the following complications: None.  Cord pH: not obtained.  Delayed cord clamping observed.   ? ?Anesthesia: Epidural ?Episiotomy: None ?Lacerations: None ?Suture Repair:  None ?Est. Blood Loss (mL):  250 ml ? ?Mom to postpartum.  Baby to Couplet care / Skin to Skin. ? ?ARubie Maid MD ?05/02/2021, 1:48 PM ? ?

## 2021-01-21 ENCOUNTER — Encounter: Payer: Self-pay | Admitting: Obstetrics and Gynecology

## 2021-01-21 ENCOUNTER — Ambulatory Visit (INDEPENDENT_AMBULATORY_CARE_PROVIDER_SITE_OTHER): Payer: Self-pay | Admitting: Obstetrics and Gynecology

## 2021-01-21 ENCOUNTER — Other Ambulatory Visit: Payer: Self-pay

## 2021-01-21 VITALS — BP 130/83 | HR 95 | Wt 177.5 lb

## 2021-01-21 DIAGNOSIS — Z131 Encounter for screening for diabetes mellitus: Secondary | ICD-10-CM

## 2021-01-21 DIAGNOSIS — Z113 Encounter for screening for infections with a predominantly sexual mode of transmission: Secondary | ICD-10-CM

## 2021-01-21 DIAGNOSIS — Z3A24 24 weeks gestation of pregnancy: Secondary | ICD-10-CM

## 2021-01-21 DIAGNOSIS — Z3402 Encounter for supervision of normal first pregnancy, second trimester: Secondary | ICD-10-CM

## 2021-01-21 LAB — POCT URINALYSIS DIPSTICK OB
Bilirubin, UA: NEGATIVE
Blood, UA: NEGATIVE
Glucose, UA: NEGATIVE
Ketones, UA: NEGATIVE
Leukocytes, UA: NEGATIVE
Nitrite, UA: NEGATIVE
POC,PROTEIN,UA: NEGATIVE
Spec Grav, UA: 1.025 (ref 1.010–1.025)
Urobilinogen, UA: 0.2 E.U./dL
pH, UA: 6 (ref 5.0–8.0)

## 2021-01-21 NOTE — Progress Notes (Signed)
ROB: She is doing well with minimal issues.  She does complain of occasional nightmares and difficulty sleeping.  Use of Unisom discussed.  Reports daily fetal movement.  1 hour GCT next visit.

## 2021-01-21 NOTE — Progress Notes (Signed)
ROB: She is not sleeping well at night because of dreams and nightmares.

## 2021-02-18 ENCOUNTER — Other Ambulatory Visit: Payer: 59

## 2021-02-18 ENCOUNTER — Encounter: Payer: Self-pay | Admitting: Obstetrics and Gynecology

## 2021-02-18 ENCOUNTER — Other Ambulatory Visit: Payer: Self-pay

## 2021-02-18 ENCOUNTER — Ambulatory Visit (INDEPENDENT_AMBULATORY_CARE_PROVIDER_SITE_OTHER): Payer: 59 | Admitting: Obstetrics and Gynecology

## 2021-02-18 VITALS — BP 113/66 | HR 99 | Wt 193.4 lb

## 2021-02-18 DIAGNOSIS — Z23 Encounter for immunization: Secondary | ICD-10-CM

## 2021-02-18 DIAGNOSIS — Z3401 Encounter for supervision of normal first pregnancy, first trimester: Secondary | ICD-10-CM

## 2021-02-18 DIAGNOSIS — Z131 Encounter for screening for diabetes mellitus: Secondary | ICD-10-CM | POA: Diagnosis not present

## 2021-02-18 DIAGNOSIS — R69 Illness, unspecified: Secondary | ICD-10-CM | POA: Diagnosis not present

## 2021-02-18 DIAGNOSIS — Z3A28 28 weeks gestation of pregnancy: Secondary | ICD-10-CM

## 2021-02-18 DIAGNOSIS — Z113 Encounter for screening for infections with a predominantly sexual mode of transmission: Secondary | ICD-10-CM

## 2021-02-18 LAB — POCT URINALYSIS DIPSTICK OB
Bilirubin, UA: NEGATIVE
Blood, UA: NEGATIVE
Glucose, UA: NEGATIVE
Ketones, UA: NEGATIVE
Leukocytes, UA: NEGATIVE
Nitrite, UA: NEGATIVE
Spec Grav, UA: 1.01 (ref 1.010–1.025)
Urobilinogen, UA: 0.2 E.U./dL
pH, UA: 7 (ref 5.0–8.0)

## 2021-02-18 NOTE — Progress Notes (Signed)
ROB: She is doing well, no new concerns.  BTC, Tdap and Glucose done today.

## 2021-02-18 NOTE — Patient Instructions (Signed)

## 2021-02-18 NOTE — Progress Notes (Signed)
ROB: Patient notes some difficulty breathing at times and back pain.  Discussed comfort measures. For 28 week labs today.  She plans to breastfeed, considering Condoms for contraception. For Tdap today, signed blood consent.

## 2021-02-19 LAB — CBC
Hematocrit: 29.6 % — ABNORMAL LOW (ref 34.0–46.6)
Hemoglobin: 9.9 g/dL — ABNORMAL LOW (ref 11.1–15.9)
MCH: 27.9 pg (ref 26.6–33.0)
MCHC: 33.4 g/dL (ref 31.5–35.7)
MCV: 83 fL (ref 79–97)
Platelets: 222 10*3/uL (ref 150–450)
RBC: 3.55 x10E6/uL — ABNORMAL LOW (ref 3.77–5.28)
RDW: 13 % (ref 11.7–15.4)
WBC: 15.8 10*3/uL — ABNORMAL HIGH (ref 3.4–10.8)

## 2021-02-19 LAB — RPR: RPR Ser Ql: NONREACTIVE

## 2021-02-19 LAB — GLUCOSE, 1 HOUR GESTATIONAL: Gestational Diabetes Screen: 93 mg/dL (ref 70–139)

## 2021-02-19 NOTE — Progress Notes (Signed)
[place]: Your glucose testing was normal.  However, it appears as if your hemoglobin is a little bit low.  I would recommend beginning over-the-counter iron pills twice daily with meals to bring your count up a little bit before delivery.  We can talk about this further at your next visit.

## 2021-02-24 DIAGNOSIS — Z3483 Encounter for supervision of other normal pregnancy, third trimester: Secondary | ICD-10-CM | POA: Diagnosis not present

## 2021-02-24 DIAGNOSIS — Z3482 Encounter for supervision of other normal pregnancy, second trimester: Secondary | ICD-10-CM | POA: Diagnosis not present

## 2021-03-05 ENCOUNTER — Encounter: Payer: Self-pay | Admitting: Obstetrics and Gynecology

## 2021-03-05 ENCOUNTER — Other Ambulatory Visit: Payer: Self-pay

## 2021-03-05 ENCOUNTER — Ambulatory Visit (INDEPENDENT_AMBULATORY_CARE_PROVIDER_SITE_OTHER): Payer: 59 | Admitting: Obstetrics and Gynecology

## 2021-03-05 VITALS — BP 121/78 | HR 102 | Wt 198.2 lb

## 2021-03-05 DIAGNOSIS — Z3A30 30 weeks gestation of pregnancy: Secondary | ICD-10-CM

## 2021-03-05 DIAGNOSIS — Z3402 Encounter for supervision of normal first pregnancy, second trimester: Secondary | ICD-10-CM

## 2021-03-05 LAB — POCT URINALYSIS DIPSTICK OB
Bilirubin, UA: NEGATIVE
Blood, UA: NEGATIVE
Glucose, UA: NEGATIVE
Ketones, UA: NEGATIVE
Leukocytes, UA: NEGATIVE
Nitrite, UA: NEGATIVE
POC,PROTEIN,UA: NEGATIVE
Spec Grav, UA: 1.01 (ref 1.010–1.025)
Urobilinogen, UA: 0.2 E.U./dL
pH, UA: 6.5 (ref 5.0–8.0)

## 2021-03-05 NOTE — Progress Notes (Signed)
ROB: Doing well.  No complaints.  Reports daily movement.  Questions regarding labor and delivery and postpartum care discussed and answered.  Questions regarding fetal vaccines and medications discussed.

## 2021-03-05 NOTE — Progress Notes (Signed)
ROB. Patient states fetal movement without pressure. Patient states no questions or concerns at this time.

## 2021-03-18 ENCOUNTER — Ambulatory Visit (INDEPENDENT_AMBULATORY_CARE_PROVIDER_SITE_OTHER): Payer: 59 | Admitting: Obstetrics and Gynecology

## 2021-03-18 ENCOUNTER — Encounter: Payer: Self-pay | Admitting: Obstetrics and Gynecology

## 2021-03-18 ENCOUNTER — Other Ambulatory Visit: Payer: Self-pay

## 2021-03-18 VITALS — BP 115/67 | HR 99 | Wt 204.1 lb

## 2021-03-18 DIAGNOSIS — O2603 Excessive weight gain in pregnancy, third trimester: Secondary | ICD-10-CM

## 2021-03-18 DIAGNOSIS — O26899 Other specified pregnancy related conditions, unspecified trimester: Secondary | ICD-10-CM

## 2021-03-18 DIAGNOSIS — Z3403 Encounter for supervision of normal first pregnancy, third trimester: Secondary | ICD-10-CM

## 2021-03-18 DIAGNOSIS — G5600 Carpal tunnel syndrome, unspecified upper limb: Secondary | ICD-10-CM | POA: Insufficient documentation

## 2021-03-18 DIAGNOSIS — Z3A31 31 weeks gestation of pregnancy: Secondary | ICD-10-CM

## 2021-03-18 LAB — POCT URINALYSIS DIPSTICK OB
Bilirubin, UA: NEGATIVE
Blood, UA: NEGATIVE
Glucose, UA: NEGATIVE
Ketones, UA: NEGATIVE
Leukocytes, UA: NEGATIVE
Nitrite, UA: NEGATIVE
Spec Grav, UA: 1.015 (ref 1.010–1.025)
Urobilinogen, UA: 0.2 E.U./dL
pH, UA: 7 (ref 5.0–8.0)

## 2021-03-18 NOTE — Progress Notes (Signed)
ROB: Patient doing well, no complaints. Discussed pain management in labor. Discussed weight gain in pregnancy (TWG 56 lbs), advised on monitoring diet, can increase some physical activity such as walking when weather permits.  Complains of numbness in right hand. Discussed use of wrist brace for likely carpal tunnel.  RTC in 2 weeks.  ?

## 2021-03-18 NOTE — Progress Notes (Signed)
ROB:She is doing well, no new concerns today. ?

## 2021-04-01 ENCOUNTER — Other Ambulatory Visit: Payer: Self-pay

## 2021-04-01 ENCOUNTER — Ambulatory Visit (INDEPENDENT_AMBULATORY_CARE_PROVIDER_SITE_OTHER): Payer: 59 | Admitting: Obstetrics and Gynecology

## 2021-04-01 ENCOUNTER — Encounter: Payer: Self-pay | Admitting: Obstetrics and Gynecology

## 2021-04-01 VITALS — BP 132/83 | HR 111 | Wt 210.1 lb

## 2021-04-01 DIAGNOSIS — Z3403 Encounter for supervision of normal first pregnancy, third trimester: Secondary | ICD-10-CM

## 2021-04-01 DIAGNOSIS — Z3A34 34 weeks gestation of pregnancy: Secondary | ICD-10-CM

## 2021-04-01 LAB — POCT URINALYSIS DIPSTICK OB
Bilirubin, UA: NEGATIVE
Blood, UA: NEGATIVE
Glucose, UA: NEGATIVE
Ketones, UA: NEGATIVE
Leukocytes, UA: NEGATIVE
Nitrite, UA: NEGATIVE
POC,PROTEIN,UA: NEGATIVE
Spec Grav, UA: 1.02 (ref 1.010–1.025)
Urobilinogen, UA: 0.2 E.U./dL
pH, UA: 6.5 (ref 5.0–8.0)

## 2021-04-01 NOTE — Progress Notes (Signed)
ROB. Patient states swelling and numbness in both hands and arms. She states this is painful for her and impacting sleep. Patient states trying compression gloves with no help. Patient states no questions or concerns at this time.   ?

## 2021-04-01 NOTE — Progress Notes (Signed)
ROB: Patient with some numbness in her hands consistent with carpal tunnel.  We have discussed neutral wrist splints at night to help with this.  Reports daily fetal movement.  Cultures next visit. ?

## 2021-04-15 ENCOUNTER — Encounter: Payer: Self-pay | Admitting: Obstetrics and Gynecology

## 2021-04-15 ENCOUNTER — Ambulatory Visit (INDEPENDENT_AMBULATORY_CARE_PROVIDER_SITE_OTHER): Payer: 59 | Admitting: Obstetrics and Gynecology

## 2021-04-15 VITALS — BP 131/80 | HR 98 | Wt 215.0 lb

## 2021-04-15 DIAGNOSIS — Z3403 Encounter for supervision of normal first pregnancy, third trimester: Secondary | ICD-10-CM

## 2021-04-15 DIAGNOSIS — Z3A36 36 weeks gestation of pregnancy: Secondary | ICD-10-CM

## 2021-04-15 DIAGNOSIS — Z113 Encounter for screening for infections with a predominantly sexual mode of transmission: Secondary | ICD-10-CM | POA: Diagnosis not present

## 2021-04-15 DIAGNOSIS — Z3685 Encounter for antenatal screening for Streptococcus B: Secondary | ICD-10-CM | POA: Diagnosis not present

## 2021-04-15 DIAGNOSIS — R69 Illness, unspecified: Secondary | ICD-10-CM | POA: Diagnosis not present

## 2021-04-15 LAB — POCT URINALYSIS DIPSTICK OB
Bilirubin, UA: NEGATIVE
Blood, UA: NEGATIVE
Glucose, UA: NEGATIVE
Ketones, UA: NEGATIVE
Leukocytes, UA: NEGATIVE
Nitrite, UA: NEGATIVE
POC,PROTEIN,UA: NEGATIVE
Spec Grav, UA: 1.01 (ref 1.010–1.025)
Urobilinogen, UA: 0.2 E.U./dL
pH, UA: 6.5 (ref 5.0–8.0)

## 2021-04-15 NOTE — Patient Instructions (Signed)
Signs and Symptoms of Labor Labor is the body's natural process of moving the baby and the placenta out of the uterus. The process of labor usually starts when the baby is full-term, between 39 and 41 weeks of pregnancy. Signs and symptoms that you are close to going into labor As your body prepares for labor and the birth of your baby, you may notice the following symptoms in the weeks and days before true labor starts: Passing a small amount of thick, bloody mucus from your vagina. This is called normal bloody show or losing your mucus plug. This may happen more than a week before labor begins, or right before labor begins, as the opening of the cervix starts to widen (dilate). For some women, the entire mucus plug passes at once. For others, pieces of the mucus plug may gradually pass over several days. Your baby moving (dropping) lower in your pelvis to get into position for birth (lightening). When this happens, you may feel more pressure on your bladder and pelvic bone and less pressure on your ribs. This may make it easier to breathe. It may also cause you to need to urinate more often and have problems with bowel movements. Having "practice contractions," also called Braxton Hicks contractions or false labor. These occur at irregular (unevenly spaced) intervals that are more than 10 minutes apart. False labor contractions are common after exercise or sexual activity. They will stop if you change position, rest, or drink fluids. These contractions are usually mild and do not get stronger over time. They may feel like: A backache or back pain. Mild cramps, similar to menstrual cramps. Tightening or pressure in your abdomen. Other early symptoms include: Nausea or loss of appetite. Diarrhea. Having a sudden burst of energy, or feeling very tired. Mood changes. Having trouble sleeping. Signs and symptoms that labor has begun Signs that you are in labor may include: Having contractions that come  at regular (evenly spaced) intervals and increase in intensity. This may feel like more intense tightening or pressure in your abdomen that moves to your back. Contractions may also feel like rhythmic pain in your upper thighs or back that comes and goes at regular intervals. If you are delivering for the first time, this change in intensity of contractions often occurs at a more gradual pace. If you have given birth before, you may notice a more rapid progression of contraction changes. Feeling pressure in the vaginal area. Your water breaking (rupture of membranes). This is when the sac of fluid that surrounds your baby breaks. Fluid leaking from your vagina may be clear or blood-tinged. Labor usually starts within 24 hours of your water breaking, but it may take longer to begin. Some people may feel a sudden gush of fluid; others may notice repeatedly damp underwear. Follow these instructions at home:  When labor starts, or if your water breaks, call your health care provider or nurse care line. Based on your situation, they will determine when you should go in for an exam. During early labor, you may be able to rest and manage symptoms at home. Some strategies to try at home include: Breathing and relaxation techniques. Taking a warm bath or shower. Listening to music. Using a heating pad on the lower back for pain. If directed, apply heat to the area as often as told by your health care provider. Use the heat source that your health care provider recommends, such as a moist heat pack or a heating pad. Place a   towel between your skin and the heat source. ?Leave the heat on for 20-30 minutes. ?Remove the heat if your skin turns bright red. This is especially important if you are unable to feel pain, heat, or cold. You have a greater risk of getting burned. ?Contact a health care provider if: ?Your labor has started. ?Your water breaks. ?You have nausea, vomiting, or diarrhea. ?Get help right away  if: ?You have painful, regular contractions that are 5 minutes apart or less. ?Labor starts before you are [redacted] weeks along in your pregnancy. ?You have a fever. ?You have bright red blood coming from your vagina. ?You do not feel your baby moving. ?You have a severe headache with or without vision problems. ?You have chest pain or shortness of breath. ?These symptoms may represent a serious problem that is an emergency. Do not wait to see if the symptoms will go away. Get medical help right away. Call your local emergency services (911 in the [place] Do not drive yourself to the hospital. ?Summary ?Labor is your body's natural process of moving your baby and the placenta out of your uterus. ?The process of labor usually starts when your baby is full-term, between 64 and 40 weeks of pregnancy. ?When labor starts, or if your water breaks, call your health care provider or nurse care line. Based on your situation, they will determine when you should go in for an exam. ?This information is not intended to replace advice given to you by your health care provider. Make sure you discuss any questions you have with your health care provider. ?Document Revised: 05/14/2020 Document Reviewed: 05/14/2020 ?Elsevier Patient Education ? [place]. ? ?

## 2021-04-15 NOTE — Progress Notes (Signed)
ROB: Patient doing well, no issues.  36 week cultures done. Given labor precautions. Discussed birth plan. Has questions.  RTC in 1 week.  ?

## 2021-04-15 NOTE — Progress Notes (Signed)
ROB: She is doing well. No new concerns. Unable to leave urine at intake. ?

## 2021-04-17 ENCOUNTER — Encounter: Payer: Self-pay | Admitting: Obstetrics and Gynecology

## 2021-04-17 LAB — GC/CHLAMYDIA PROBE AMP
Chlamydia trachomatis, NAA: NEGATIVE
Neisseria Gonorrhoeae by PCR: NEGATIVE

## 2021-04-17 LAB — STREP GP B NAA: Strep Gp B NAA: NEGATIVE

## 2021-04-21 DIAGNOSIS — Z331 Pregnant state, incidental: Secondary | ICD-10-CM | POA: Diagnosis not present

## 2021-04-21 DIAGNOSIS — G5603 Carpal tunnel syndrome, bilateral upper limbs: Secondary | ICD-10-CM | POA: Diagnosis not present

## 2021-04-23 ENCOUNTER — Encounter: Payer: Self-pay | Admitting: Obstetrics and Gynecology

## 2021-04-23 ENCOUNTER — Ambulatory Visit (INDEPENDENT_AMBULATORY_CARE_PROVIDER_SITE_OTHER): Payer: 59 | Admitting: Obstetrics and Gynecology

## 2021-04-23 VITALS — BP 129/89 | HR 90 | Wt 223.2 lb

## 2021-04-23 DIAGNOSIS — Z3403 Encounter for supervision of normal first pregnancy, third trimester: Secondary | ICD-10-CM

## 2021-04-23 DIAGNOSIS — Z3A37 37 weeks gestation of pregnancy: Secondary | ICD-10-CM

## 2021-04-23 LAB — POCT URINALYSIS DIPSTICK OB
Bilirubin, UA: NEGATIVE
Blood, UA: NEGATIVE
Glucose, UA: NEGATIVE
Ketones, UA: NEGATIVE
Leukocytes, UA: NEGATIVE
Nitrite, UA: NEGATIVE
POC,PROTEIN,UA: NEGATIVE
Spec Grav, UA: 1.01 (ref 1.010–1.025)
Urobilinogen, UA: 0.2 E.U./dL
pH, UA: 6.5 (ref 5.0–8.0)

## 2021-04-23 NOTE — Progress Notes (Signed)
ROB. Patient states fetal movement with no pain or pressure. She states she had cortisone injections on Monday for her swelling in hands and feet, no relief from this yet. Patient states no questions or concerns at this time.  ? ?

## 2021-04-23 NOTE — Progress Notes (Signed)
ROB: She is having significant swelling in her lower extremities and her hands.  She has numbness and tingling in all of her fingers.  2 days ago she had cortisone injections of both wrists but this has "not helped yet".  She continues to use wrist splints.  She reports no contractions.  Reports daily fetal movement.  She continues to work on a birth plan but she says there are many answers she "cannot decide about". ?

## 2021-04-30 ENCOUNTER — Encounter: Payer: 59 | Admitting: Obstetrics and Gynecology

## 2021-04-30 ENCOUNTER — Ambulatory Visit (INDEPENDENT_AMBULATORY_CARE_PROVIDER_SITE_OTHER): Payer: 59 | Admitting: Obstetrics and Gynecology

## 2021-04-30 ENCOUNTER — Encounter: Payer: Self-pay | Admitting: Obstetrics and Gynecology

## 2021-04-30 VITALS — BP 123/81 | HR 105 | Wt 220.8 lb

## 2021-04-30 DIAGNOSIS — Z3A38 38 weeks gestation of pregnancy: Secondary | ICD-10-CM

## 2021-04-30 DIAGNOSIS — Z3403 Encounter for supervision of normal first pregnancy, third trimester: Secondary | ICD-10-CM

## 2021-04-30 LAB — POCT URINALYSIS DIPSTICK OB
Glucose, UA: NEGATIVE
Ketones, UA: NEGATIVE
Nitrite, UA: NEGATIVE
Spec Grav, UA: 1.015 (ref 1.010–1.025)
pH, UA: 6 (ref 5.0–8.0)

## 2021-04-30 NOTE — Patient Instructions (Signed)
Signs and Symptoms of Labor Labor is the body's natural process of moving the baby and the placenta out of the uterus. The process of labor usually starts when the baby is full-term, between 39 and 41 weeks of pregnancy. Signs and symptoms that you are close to going into labor As your body prepares for labor and the birth of your baby, you may notice the following symptoms in the weeks and days before true labor starts: Passing a small amount of thick, bloody mucus from your vagina. This is called normal bloody show or losing your mucus plug. This may happen more than a week before labor begins, or right before labor begins, as the opening of the cervix starts to widen (dilate). For some women, the entire mucus plug passes at once. For others, pieces of the mucus plug may gradually pass over several days. Your baby moving (dropping) lower in your pelvis to get into position for birth (lightening). When this happens, you may feel more pressure on your bladder and pelvic bone and less pressure on your ribs. This may make it easier to breathe. It may also cause you to need to urinate more often and have problems with bowel movements. Having "practice contractions," also called Braxton Hicks contractions or false labor. These occur at irregular (unevenly spaced) intervals that are more than 10 minutes apart. False labor contractions are common after exercise or sexual activity. They will stop if you change position, rest, or drink fluids. These contractions are usually mild and do not get stronger over time. They may feel like: A backache or back pain. Mild cramps, similar to menstrual cramps. Tightening or pressure in your abdomen. Other early symptoms include: Nausea or loss of appetite. Diarrhea. Having a sudden burst of energy, or feeling very tired. Mood changes. Having trouble sleeping. Signs and symptoms that labor has begun Signs that you are in labor may include: Having contractions that come  at regular (evenly spaced) intervals and increase in intensity. This may feel like more intense tightening or pressure in your abdomen that moves to your back. Contractions may also feel like rhythmic pain in your upper thighs or back that comes and goes at regular intervals. If you are delivering for the first time, this change in intensity of contractions often occurs at a more gradual pace. If you have given birth before, you may notice a more rapid progression of contraction changes. Feeling pressure in the vaginal area. Your water breaking (rupture of membranes). This is when the sac of fluid that surrounds your baby breaks. Fluid leaking from your vagina may be clear or blood-tinged. Labor usually starts within 24 hours of your water breaking, but it may take longer to begin. Some people may feel a sudden gush of fluid; others may notice repeatedly damp underwear. Follow these instructions at home:  When labor starts, or if your water breaks, call your health care provider or nurse care line. Based on your situation, they will determine when you should go in for an exam. During early labor, you may be able to rest and manage symptoms at home. Some strategies to try at home include: Breathing and relaxation techniques. Taking a warm bath or shower. Listening to music. Using a heating pad on the lower back for pain. If directed, apply heat to the area as often as told by your health care provider. Use the heat source that your health care provider recommends, such as a moist heat pack or a heating pad. Place a   towel between your skin and the heat source. Leave the heat on for 20-30 minutes. Remove the heat if your skin turns bright red. This is especially important if you are unable to feel pain, heat, or cold. You have a greater risk of getting burned. Contact a health care provider if: Your labor has started. Your water breaks. You have nausea, vomiting, or diarrhea. Get help right away  if: You have painful, regular contractions that are 5 minutes apart or less. Labor starts before you are [redacted] weeks along in your pregnancy. You have a fever. You have bright red blood coming from your vagina. You do not feel your baby moving. You have a severe headache with or without vision problems. You have chest pain or shortness of breath. These symptoms may represent a serious problem that is an emergency. Do not wait to see if the symptoms will go away. Get medical help right away. Call your local emergency services (911 in the [place] Do not drive yourself to the hospital. Summary Labor is your body's natural process of moving your baby and the placenta out of your uterus. The process of labor usually starts when your baby is full-term, between 39 and 40 weeks of pregnancy. When labor starts, or if your water breaks, call your health care provider or nurse care line. Based on your situation, they will determine when you should go in for an exam. This information is not intended to replace advice given to you by your health care provider. Make sure you discuss any questions you have with your health care provider. Document Revised: 05/14/2020 Document Reviewed: 05/14/2020 Elsevier Patient Education  2023 Elsevier Inc.  

## 2021-04-30 NOTE — Progress Notes (Signed)
ROB: She is doing well. No new concerns today. ?

## 2021-04-30 NOTE — Progress Notes (Signed)
ROB: Patient doing well, no major complaints.  Still noting some swelling in her legs.  Is trying to elevate her feet when resting.  Reviewed labor precautions with patient.  Discussed natural cervical ripening agents, given handout.  RTC in 1 week. ?

## 2021-05-02 ENCOUNTER — Encounter: Payer: Self-pay | Admitting: Anesthesiology

## 2021-05-02 ENCOUNTER — Other Ambulatory Visit: Payer: Self-pay

## 2021-05-02 ENCOUNTER — Inpatient Hospital Stay
Admission: EM | Admit: 2021-05-02 | Discharge: 2021-05-03 | DRG: 807 | Disposition: A | Discharge status: Home / Self Care | Payer: 59 | Attending: Obstetrics and Gynecology | Admitting: Obstetrics and Gynecology

## 2021-05-02 ENCOUNTER — Inpatient Hospital Stay: Payer: 59 | Admitting: Anesthesiology

## 2021-05-02 ENCOUNTER — Encounter: Payer: Self-pay | Admitting: Obstetrics and Gynecology

## 2021-05-02 DIAGNOSIS — Z3A38 38 weeks gestation of pregnancy: Secondary | ICD-10-CM

## 2021-05-02 DIAGNOSIS — O0903 Supervision of pregnancy with history of infertility, third trimester: Secondary | ICD-10-CM | POA: Diagnosis not present

## 2021-05-02 DIAGNOSIS — Z3403 Encounter for supervision of normal first pregnancy, third trimester: Secondary | ICD-10-CM

## 2021-05-02 DIAGNOSIS — O09899 Supervision of other high risk pregnancies, unspecified trimester: Secondary | ICD-10-CM

## 2021-05-02 DIAGNOSIS — O26893 Other specified pregnancy related conditions, third trimester: Secondary | ICD-10-CM | POA: Diagnosis not present

## 2021-05-02 DIAGNOSIS — Z2839 Other underimmunization status: Secondary | ICD-10-CM

## 2021-05-02 LAB — ABO/RH: ABO/RH(D): A POS

## 2021-05-02 LAB — CBC
HCT: 42.3 % (ref 36.0–46.0)
Hemoglobin: 13.8 g/dL (ref 12.0–15.0)
MCH: 29.4 pg (ref 26.0–34.0)
MCHC: 32.6 g/dL (ref 30.0–36.0)
MCV: 90.2 fL (ref 80.0–100.0)
Platelets: 146 10*3/uL — ABNORMAL LOW (ref 150–400)
RBC: 4.69 MIL/uL (ref 3.87–5.11)
RDW: 22.2 % — ABNORMAL HIGH (ref 11.5–15.5)
WBC: 17 10*3/uL — ABNORMAL HIGH (ref 4.0–10.5)
nRBC: 0 % (ref 0.0–0.2)

## 2021-05-02 LAB — TYPE AND SCREEN
ABO/RH(D): A POS
Antibody Screen: NEGATIVE

## 2021-05-02 MED ORDER — COCONUT OIL OIL
1.0000 "application " | TOPICAL_OIL | Status: DC | PRN
  Filled 2021-05-02: qty 120

## 2021-05-02 MED ORDER — OXYTOCIN-SODIUM CHLORIDE 30-0.9 UT/500ML-% IV SOLN: 1.0000 m[IU]/min | INTRAVENOUS | Status: DC

## 2021-05-02 MED ORDER — FENTANYL-BUPIVACAINE-NACL 0.5-0.125-0.9 MG/250ML-% EP SOLN
12.0000 mL/h | EPIDURAL | Status: DC | PRN
  Administered 2021-05-02: 12 mL/h via EPIDURAL

## 2021-05-02 MED ORDER — ONDANSETRON HCL 4 MG PO TABS: 4.0000 mg | ORAL_TABLET | ORAL | Status: DC | PRN

## 2021-05-02 MED ORDER — ONDANSETRON HCL 4 MG/2ML IJ SOLN: 4.0000 mg | Freq: Four times a day (QID) | INTRAMUSCULAR | Status: DC | PRN

## 2021-05-02 MED ORDER — OXYTOCIN-SODIUM CHLORIDE 30-0.9 UT/500ML-% IV SOLN
2.5000 [IU]/h | INTRAVENOUS | Status: DC
  Administered 2021-05-02: 2.5 [IU]/h via INTRAVENOUS

## 2021-05-02 MED ORDER — TERBUTALINE SULFATE 1 MG/ML IJ SOLN: 0.2500 mg | Freq: Once | INTRAMUSCULAR | Status: DC | PRN

## 2021-05-02 MED ORDER — LIDOCAINE HCL (PF) 1 % IJ SOLN: 30.0000 mL | INTRAMUSCULAR | Status: DC | PRN

## 2021-05-02 MED ORDER — LACTATED RINGERS IV BOLUS
1000.0000 mL | Freq: Once | INTRAVENOUS | Status: AC
  Administered 2021-05-02: 1000 mL via INTRAVENOUS

## 2021-05-02 MED ORDER — IBUPROFEN 600 MG PO TABS
600.0000 mg | ORAL_TABLET | Freq: Four times a day (QID) | ORAL | Status: DC
  Administered 2021-05-02 – 2021-05-03 (×5): 600 mg via ORAL
  Filled 2021-05-02 (×5): qty 1

## 2021-05-02 MED ORDER — ACETAMINOPHEN 325 MG PO TABS
650.0000 mg | ORAL_TABLET | ORAL | Status: DC | PRN
  Administered 2021-05-02 – 2021-05-03 (×5): 650 mg via ORAL
  Filled 2021-05-02 (×5): qty 2

## 2021-05-02 MED ORDER — ACETAMINOPHEN 325 MG PO TABS: 650.0000 mg | ORAL_TABLET | ORAL | Status: DC | PRN

## 2021-05-02 MED ORDER — WITCH HAZEL-GLYCERIN EX PADS
1.0000 "application " | MEDICATED_PAD | CUTANEOUS | Status: DC | PRN
  Filled 2021-05-02: qty 100

## 2021-05-02 MED ORDER — DIPHENHYDRAMINE HCL 25 MG PO CAPS: 25.0000 mg | ORAL_CAPSULE | Freq: Four times a day (QID) | ORAL | Status: DC | PRN

## 2021-05-02 MED ORDER — BUPIVACAINE HCL (PF) 0.25 % IJ SOLN
INTRAMUSCULAR | Status: DC | PRN
  Administered 2021-05-02 (×2): 4 mL via EPIDURAL

## 2021-05-02 MED ORDER — LIDOCAINE HCL (PF) 1 % IJ SOLN
INTRAMUSCULAR | Status: AC
  Filled 2021-05-02: qty 30

## 2021-05-02 MED ORDER — SIMETHICONE 80 MG PO CHEW: 80.0000 mg | CHEWABLE_TABLET | ORAL | Status: DC | PRN

## 2021-05-02 MED ORDER — FENTANYL-BUPIVACAINE-NACL 0.5-0.125-0.9 MG/250ML-% EP SOLN
EPIDURAL | Status: AC
  Filled 2021-05-02: qty 250

## 2021-05-02 MED ORDER — LACTATED RINGERS IV SOLN: 500.0000 mL | INTRAVENOUS | Status: DC | PRN

## 2021-05-02 MED ORDER — DIPHENHYDRAMINE HCL 50 MG/ML IJ SOLN: 12.5000 mg | INTRAMUSCULAR | Status: DC | PRN

## 2021-05-02 MED ORDER — PHENYLEPHRINE 80 MCG/ML (10ML) SYRINGE FOR IV PUSH (FOR BLOOD PRESSURE SUPPORT): 80.0000 ug | PREFILLED_SYRINGE | INTRAVENOUS | Status: DC | PRN

## 2021-05-02 MED ORDER — EPHEDRINE 5 MG/ML INJ: 10.0000 mg | INTRAVENOUS | Status: DC | PRN

## 2021-05-02 MED ORDER — LIDOCAINE-EPINEPHRINE (PF) 1.5 %-1:200000 IJ SOLN
INTRAMUSCULAR | Status: DC | PRN
Start: 2021-05-02 — End: 2021-05-02
  Administered 2021-05-02: 3 mL via PERINEURAL

## 2021-05-02 MED ORDER — AMMONIA AROMATIC IN INHA
RESPIRATORY_TRACT | Status: AC
  Filled 2021-05-02: qty 10

## 2021-05-02 MED ORDER — DIBUCAINE (PERIANAL) 1 % EX OINT: 1.0000 "application " | TOPICAL_OINTMENT | CUTANEOUS | Status: DC | PRN

## 2021-05-02 MED ORDER — OXYCODONE-ACETAMINOPHEN 5-325 MG PO TABS: 1.0000 | ORAL_TABLET | ORAL | Status: DC | PRN

## 2021-05-02 MED ORDER — SOD CITRATE-CITRIC ACID 500-334 MG/5ML PO SOLN: 30.0000 mL | ORAL | Status: DC | PRN

## 2021-05-02 MED ORDER — VARICELLA VIRUS VACCINE LIVE 1350 PFU/0.5ML IJ SUSR
0.5000 mL | INTRAMUSCULAR | Status: DC | PRN
  Filled 2021-05-02: qty 0.5

## 2021-05-02 MED ORDER — ZOLPIDEM TARTRATE 5 MG PO TABS: 5.0000 mg | ORAL_TABLET | Freq: Every evening | ORAL | Status: DC | PRN

## 2021-05-02 MED ORDER — OXYTOCIN BOLUS FROM INFUSION: 333.0000 mL | Freq: Once | INTRAVENOUS | Status: AC

## 2021-05-02 MED ORDER — PRENATAL MULTIVITAMIN CH
1.0000 | ORAL_TABLET | Freq: Every day | ORAL | Status: DC
  Administered 2021-05-03: 1 via ORAL
  Filled 2021-05-02: qty 1

## 2021-05-02 MED ORDER — ONDANSETRON HCL 4 MG/2ML IJ SOLN: 4.0000 mg | INTRAMUSCULAR | Status: DC | PRN

## 2021-05-02 MED ORDER — BUTORPHANOL TARTRATE 1 MG/ML IJ SOLN
1.0000 mg | INTRAMUSCULAR | Status: DC | PRN
  Administered 2021-05-02: 1 mg via INTRAVENOUS
  Filled 2021-05-02: qty 1

## 2021-05-02 MED ORDER — BENZOCAINE-MENTHOL 20-0.5 % EX AERO
1.0000 "application " | INHALATION_SPRAY | CUTANEOUS | Status: DC | PRN
  Filled 2021-05-02: qty 56

## 2021-05-02 MED ORDER — SENNOSIDES-DOCUSATE SODIUM 8.6-50 MG PO TABS
2.0000 | ORAL_TABLET | Freq: Every day | ORAL | Status: DC
  Administered 2021-05-03: 2 via ORAL
  Filled 2021-05-02: qty 2

## 2021-05-02 MED ORDER — MISOPROSTOL 200 MCG PO TABS
ORAL_TABLET | ORAL | Status: AC
  Filled 2021-05-02: qty 4

## 2021-05-02 MED ORDER — OXYTOCIN-SODIUM CHLORIDE 30-0.9 UT/500ML-% IV SOLN
INTRAVENOUS | Status: AC
  Administered 2021-05-02: 333 mL via INTRAVENOUS
  Filled 2021-05-02: qty 500

## 2021-05-02 MED ORDER — LACTATED RINGERS IV SOLN
125.0000 mL/h | INTRAVENOUS | Status: DC
Start: 2021-05-02 — End: 2021-05-02
  Administered 2021-05-02: 125 mL/h via INTRAVENOUS

## 2021-05-02 MED ORDER — OXYCODONE-ACETAMINOPHEN 5-325 MG PO TABS: 2.0000 | ORAL_TABLET | ORAL | Status: DC | PRN

## 2021-05-02 MED ORDER — OXYTOCIN 10 UNIT/ML IJ SOLN
INTRAMUSCULAR | Status: AC
  Filled 2021-05-02: qty 2

## 2021-05-02 MED ORDER — LACTATED RINGERS IV SOLN
500.0000 mL | Freq: Once | INTRAVENOUS | Status: AC
  Administered 2021-05-02: 500 mL via INTRAVENOUS

## 2021-05-02 NOTE — Discharge Instructions (Signed)

## 2021-05-02 NOTE — Progress Notes (Signed)
Varicella vaccine offered to patient. Patient declined and stated she would get later on at follow-up appointment.  ?

## 2021-05-02 NOTE — Anesthesia Procedure Notes (Signed)
Epidural ?Patient location during procedure: OB ?Start time: 05/02/2021 9:51 AM ?End time: 05/02/2021 9:57 AM ? ?Staffing ?Anesthesiologist: [facility], Jane Canary, MD ?Resident/CRNA: Norm Salt, CRNA ? ?Preanesthetic Checklist ?Completed: patient identified, IV checked, site marked, risks and benefits discussed, surgical consent, monitors and equipment checked, pre-op evaluation and timeout performed ? ?Epidural ?Patient position: sitting ?Prep: ChloraPrep ?Patient monitoring: heart rate, continuous pulse ox and blood pressure ?Approach: midline ?Location: L3-L4 ?Injection technique: LOR saline ? ?Needle:  ?Needle type: Tuohy  ?Needle gauge: 17 G ?Needle length: 9 cm and 9 ?Needle insertion depth: 9 cm ?Catheter type: closed end flexible ?Catheter size: 19 Gauge ?Catheter at skin depth: 14 cm ?Test dose: negative and 1.5% lidocaine with Epi 1:200 K ? ?Assessment ?Sensory level: T10 ?Events: blood not aspirated, injection not painful, no injection resistance, no paresthesia and negative IV test ? ?Additional Notes ?1 attempt ?Pt. Evaluated and documentation done after procedure finished. ?Patient identified. Risks/Benefits/Options discussed with patient including but not limited to bleeding, infection, nerve damage, paralysis, failed block, incomplete pain control, headache, blood pressure changes, nausea, vomiting, reactions to medication both or allergic, itching and postpartum back pain. Confirmed with bedside nurse the patient's most recent platelet count. Confirmed with patient that they are not currently taking any anticoagulation, have any bleeding history or any family history of bleeding disorders. Patient expressed understanding and wished to proceed. All questions were answered. Sterile technique was used throughout the entire procedure. Please see nursing notes for vital signs. Test dose was given through epidural catheter and negative prior to continuing to dose epidural or start infusion. Warning  signs of high block given to the patient including shortness of breath, tingling/numbness in hands, complete motor block, or any concerning symptoms with instructions to call for help. Patient was given instructions on fall risk and not to get out of bed. All questions and concerns addressed with instructions to call with any issues or inadequate analgesia.   ? ?Patient tolerated the insertion well without immediate complications.Reason for block:procedure for pain ? ? ? ?

## 2021-05-02 NOTE — H&P (Signed)
Obstetric History and Physical ? ?Monica Price is a 31 y.o. G1P0000 with IUP at 24w3dpresenting for complaints of contractions since 0300 this morning. Patient states she has been having  regular, every 4-5 minutes contractions, getting stronger.  She denies vaginal bleeding, reports intact membranes and active fetal movement.   ? ?Of note, patient with a h/o anovulatory primary infertility, pregnancy conceived with use of Clomid.  ? ?Prenatal Course ?Source of Care: [facility] with onset of care at 152 weeks?Pregnancy complications or risks: ?Patient Active Problem List  ? Diagnosis Date Noted  ? Normal labor 05/02/2021  ? Maternal varicella, non-immune 05/02/2021  ? Encounter for supervision of normal first pregnancy in third trimester 04/01/2021  ? Carpal tunnel syndrome during pregnancy 03/18/2021  ? Acute neck pain 04/24/2019  ? Encounter for prescription of oral contraceptives 08/19/2017  ? Interstitial cystitis 08/19/2017  ? ?She plans to breastfeed ?She desires condoms for postpartum contraception.  ? ?Prenatal labs and studies: ?ABO, Rh: A/Positive/-- (10/10 1140) ?Antibody: Negative (10/10 1140) ?Rubella: 4.37 (10/10 1140) ?RPR: Non Reactive (02/07 0948)  ?HBsAg: Negative (10/10 1140)  ?HIV: Non Reactive (10/10 1140)  ?GYKD:XIPJASNK/- (04/04 1157) ?1 hr Glucola  normal ?Genetic screening normal ?Anatomy [place]ormal ? ? ?Past Medical History:  ?Diagnosis Date  ? Anemia   ? GERD (gastroesophageal reflux disease)   ? Interstitial cystitis   ? Microscopic hematuria   ? Recurrent UTI   ? Pt has been treated for kidney infection and UTI for 10 months  ? ? ?Past Surgical History:  ?Procedure Laterality Date  ? CYSTO WITH HYDRODISTENSION N/A 02/08/2018  ? Procedure: CYSTOSCOPY/HYDRODISTENSION;  Surgeon: WRoyston Cowper MD;  Location: [facility] ORS;  Service: Urology;  Laterality: N/A;  ? WISDOM TOOTH EXTRACTION    ? ? ?OB History  ?Gravida Para Term Preterm AB Living  ?1 0 0 0 0 0  ?SAB IAB Ectopic  Multiple Live Births  ?0 0 0 0 0  ?  ?# Outcome Date GA Lbr Len/2nd Weight Sex Delivery Anes PTL Lv  ?1 Current           ? ? ?Social History  ? ?Socioeconomic History  ? Marital status: Married  ?  Spouse name: Not on file  ? Number of children: Not on file  ? Years of education: Not on file  ? Highest education level: Not on file  ?Occupational History  ? Not on file  ?Tobacco Use  ? Smoking status: Never  ? Smokeless tobacco: Never  ?Vaping Use  ? Vaping Use: Never used  ?Substance and Sexual Activity  ? Alcohol use: Not Currently  ?  Comment: RARE  ? Drug use: Never  ? Sexual activity: Yes  ?  Birth control/protection: None  ?Other Topics Concern  ? Not on file  ?Social History Narrative  ? ** Merged History Encounter **  ?    ? ?Social Determinants of Health  ? ?Financial Resource Strain: Not on file  ?Food Insecurity: Not on file  ?Transportation Needs: Not on file  ?Physical Activity: Not on file  ?Stress: Not on file  ?Social Connections: Not on file  ? ? ?Family History  ?Problem Relation Age of Onset  ? Healthy Mother   ? Healthy Father   ? Bladder Cancer Maternal Aunt   ?     great  ? Bladder Cancer Maternal Uncle   ?     great  ? Kidney Stones Maternal Grandmother   ? Throat cancer  Maternal Grandfather   ? Kidney disease Neg Hx   ? Prostate cancer Neg Hx   ? Kidney cancer Neg Hx   ? Ovarian cancer Neg Hx   ? Breast cancer Neg Hx   ? ? ?Medications Prior to Admission  ?Medication Sig Dispense Refill Last Dose  ? ferrous sulfate 325 (65 FE) MG tablet Take 325 mg by mouth daily with breakfast.   05/01/2021  ? Prenatal Vit-Fe Fumarate-FA (MULTIVITAMIN-PRENATAL) 27-0.8 MG TABS tablet Take 1 tablet by mouth daily at 12 noon.   05/02/2021  ? ? ?No Known Allergies ? ?Review of Systems: Negative except for what is mentioned in HPI. ? ?Physical Exam: ?BP 136/89   Pulse 94   Temp 97.7 ?F (36.5 ?C) (Oral)   Resp 18   Ht '5\' 8"'$  (1.727 m)   Wt 101 kg   LMP 08/06/2020 (Exact Date)   BMI 33.86 kg/m?   ?CONSTITUTIONAL: Well-developed, well-nourished female in no acute distress.  ?HENT:  Normocephalic, atraumatic, External right and left ear normal. Oropharynx is clear and moist ?EYES: Conjunctivae and EOM are normal. Pupils are equal, round, and reactive to light. No scleral icterus.  ?NECK: Normal range of motion, supple, no masses ?SKIN: Skin is warm and dry. No rash noted. Not diaphoretic. No erythema. No pallor. ?NEUROLOGIC: Alert and oriented to person, place, and time. Normal reflexes, muscle tone coordination. No cranial nerve deficit noted. ?PSYCHIATRIC: Normal mood and affect. Normal behavior. Normal judgment and thought content. ?CARDIOVASCULAR: Normal heart rate noted, regular rhythm ?RESPIRATORY: Effort and breath sounds normal, no problems with respiration noted ?ABDOMEN: Soft, nontender, nondistended, gravid. ?MUSCULOSKELETAL: Normal range of motion. No edema and no tenderness. 2+ distal pulses. ? ?Cervical Exam: Dilatation 3 cm   Effacement 70%   Station -3   ?Presentation: cephalic ?FHT:  Baseline rate 145 bpm   Variability moderate  Accelerations present   Decelerations none ?Contractions: Every 2-4 mins ?  ?Pertinent Labs/Studies:   ?No results found for this or any previous visit (from the past 24 hour(s)). ? ?Assessment : ?Monica Price is a 31 y.o. G1P0000 at 32w3dbeing admitted for latent labor.  Varicella non-immune. H/o infertility, Clomid induction.  ? ?Plan: ?Labor: Admission labs ordered. Expectant management. Augmentation as needed with Pitocin as per protocol. Analgesia as needed. Desires epidural.  ?FWB: Reassuring fetal heart tracing.  GBS negative ?Delivery plan: Hopeful for vaginal delivery.  Varivax to be administered postpartum.  ? ? ? ?CRubie Maid MD ?[facility] ? ? ?  ?

## 2021-05-02 NOTE — Anesthesia Preprocedure Evaluation (Signed)
Anesthesia Evaluation  ?Patient identified by MRN, date of birth, ID band ?Patient awake ? ? ? ?Reviewed: ?Allergy & Precautions, NPO status , Patient's Chart, lab work & pertinent test results ? ?Airway ?Mallampati: II ? ?TM Distance: >3 FB ?Neck ROM: full ? ? ? Dental ?no notable dental hx. ?(+) Chipped ?  ?Pulmonary ?neg pulmonary ROS,  ?  ?Pulmonary exam normal ?breath sounds clear to auscultation ? ? ? ? ? ? Cardiovascular ?Exercise Tolerance: Good ?negative cardio ROS ?Normal cardiovascular exam ?Rhythm:Regular  ? ?  ?Neuro/Psych ?negative neurological ROS ? negative psych ROS  ? GI/Hepatic ?negative GI ROS, Neg liver ROS,   ?Endo/Other  ?Hypothyroidism  ? Renal/GU ?negative Renal ROS  ?negative genitourinary ?  ?Musculoskeletal ?negative musculoskeletal ROS ?(+)  ? Abdominal ?Normal abdominal exam  (+)   ?Peds ?negative pediatric ROS ?(+)  Hematology ?negative hematology ROS ?(+) Blood dyscrasia, anemia ,   ?Anesthesia Other Findings ?Past Medical History: ?No date: Anemia ?No date: GERD (gastroesophageal reflux disease) ?No date: Interstitial cystitis ?No date: Microscopic hematuria ?No date: Recurrent UTI ?    Comment:  Pt has been treated for kidney infection and UTI for 10  ?             months ? ?Past Surgical History: ?02/08/2018: CYSTO WITH HYDRODISTENSION; N/A ?    Comment:  Procedure: [id number];  Surgeon: Yves Dill,  ?             Otelia Limes, MD;  Location: [facility] OR[facility];  Service: Urology;   ?             Laterality: N/A; ?No date: WISDOM TOOTH EXTRACTION ? ?BMI   ? Body Mass Index: 33.86 kg/m?  ?  ? ? Reproductive/Obstetrics ?(+) Pregnancy ? ?  ? ? ? ? ? ? ? ? ? ? ? ? ? ?  ?  ? ? ? ? ? ? ? ? ?Anesthesia Physical ?Anesthesia Plan ? ?ASA: 2 ? ?Anesthesia Plan: Epidural  ? ?Post-op Pain Management:   ? ?Induction:  ? ?PONV Risk Score and Plan:  ? ?Airway Management Planned: Natural Airway ? ?Additional Equipment:  ? ?Intra-op Plan:  ? ?Post-operative Plan:   ? ?Informed Consent: I have reviewed the patients History and Physical, chart, labs and discussed the procedure including the risks, benefits and alternatives for the proposed anesthesia with the patient or authorized representative who has indicated his/her understanding and acceptance.  ? ? ? ? ? ?Plan Discussed with: CRNA and Surgeon ? ?Anesthesia Plan Comments:   ? ? ? ? ? ? ?Anesthesia Quick Evaluation ? ?

## 2021-05-02 NOTE — Anesthesia Preprocedure Evaluation (Signed)
Anesthesia Evaluation  ?Patient identified by MRN, date of birth, ID band ?Patient awake ? ? ? ?Reviewed: ?Allergy & Precautions, H&P , NPO status , Patient's Chart, lab work & pertinent test results ? ?History of Anesthesia Complications ?Negative for: history of anesthetic complications ? ?Airway ?Mallampati: II ? ?TM Distance: >3 FB ?Neck ROM: full ? ? ? Dental ?no notable dental hx. ? ?  ?Pulmonary ? ?  ?Pulmonary exam normal ? ? ? ? ? ? ? Cardiovascular ?negative cardio ROS ?Normal cardiovascular exam ? ? ?  ?Neuro/Psych ?negative neurological ROS ? negative psych ROS  ? GI/Hepatic ?Neg liver ROS, GERD  ,  ?Endo/Other  ? ? Renal/GU ?negative Renal ROS  ?negative genitourinary ?  ?Musculoskeletal ? ? Abdominal ?  ?Peds ? Hematology ? ?(+) Blood dyscrasia, anemia ,   ?Anesthesia Other Findings ?Past Medical History: ?No date: Anemia ?No date: GERD (gastroesophageal reflux disease) ?No date: Interstitial cystitis ?No date: Microscopic hematuria ?No date: Recurrent UTI ?    Comment:  Pt has been treated for kidney infection and UTI for 10  ?             months ? Reproductive/Obstetrics ?(+) Pregnancy ? ?  ? ? ? ? ? ? ? ? ? ? ? ? ? ?  ?  ? ? ? ? ? ? ? ? ?Anesthesia Physical ?Anesthesia Plan ? ?ASA: 2 ? ?Anesthesia Plan: Epidural  ? ?Post-op Pain Management:   ? ?Induction:  ? ?PONV Risk Score and Plan:  ? ?Airway Management Planned:  ? ?Additional Equipment:  ? ?Intra-op Plan:  ? ?Post-operative Plan:  ? ?Informed Consent: I have reviewed the patients History and Physical, chart, labs and discussed the procedure including the risks, benefits and alternatives for the proposed anesthesia with the patient or authorized representative who has indicated his/her understanding and acceptance.  ? ? ? ? ? ?Plan Discussed with: Anesthesiologist ? ?Anesthesia Plan Comments:   ? ? ? ? ? ? ?Anesthesia Quick Evaluation ? ?

## 2021-05-02 NOTE — OB Triage Note (Signed)
Pt Monica Price 32 y.o. presents to labor and delivery triage reporting contractions. Pt is a G1P0000 at [redacted]w[redacted]d. Pt denies signs and symptons consistent with rupture of membranes or active vaginal bleeding. Pt states positive fetal movement. External FM and TOCO applied to non-tender abdomen and assessing. Initial FHR 130. Vital signs obtained and within normal limits. Evans MD in department and notified of pt.  ?

## 2021-05-02 NOTE — Lactation Note (Addendum)
This note was copied from a baby's chart. ?Lactation Consultation Note ? ?Patient Name: Monica Price ?Today's Date: 05/02/2021 ?Reason for consult: L&D Initial assessment;Primapara;Early term 37-38.6wks ?Age:30 hours ? ?Maternal Data ?Has patient been taught Hand Expression?: Yes ?Does the patient have breastfeeding experience prior to this delivery?: No ? ?Feeding ?Mother's Current Feeding Choice: Breast Milk ?Mom has firm breast, tight and swollen, nipples are flat, able to express drops with and expression, baby unable to latch without nipple shield, able to latch easily with 20 mm nipple shield and nursed well on both breasts in side lying position, breasts soften slightly after nursing, swallows heard during feeding, encouraged mom to try without nipple shield as breasts soften after delivery  ?LATCH Score ?Latch: Grasps breast easily, tongue down, lips flanged, rhythmical sucking. (with nipple shield) ? ?Audible Swallowing: Spontaneous and intermittent ? ?Type of Nipple: Flat ? ?Comfort (Breast/Nipple): Soft / non-tender ? ?Hold (Positioning): Assistance needed to correctly position infant at breast and maintain latch. ? ?LATCH Score: 8 ? ? ?Lactation Tools Discussed/Used ?Tools: Nipple [employer] ?Nipple shield size: 20 ?Breast pump kit with manual pump placed at bedside in 335 for pt to try with assist tonight to help soften breast and evert nipples before attempting latch   ?Interventions ?Interventions: Breast feeding basics reviewed;Assisted with latch;Skin to skin;Hand express;Adjust position;Support pillows;Education ? ?Discharge ?Pump: Personal ?[facility] Program: No ? ?Consult Status ?Consult Status: Follow-up from L&D ? ? ? ?Ferol Luz ?05/02/2021, 3:52 PM ? ? ? ?

## 2021-05-03 ENCOUNTER — Encounter: Payer: Self-pay | Admitting: Obstetrics and Gynecology

## 2021-05-03 LAB — RPR: RPR Ser Ql: NONREACTIVE

## 2021-05-03 LAB — CBC
HCT: 36.4 % (ref 36.0–46.0)
Hemoglobin: 11.9 g/dL — ABNORMAL LOW (ref 12.0–15.0)
MCH: 30.1 pg (ref 26.0–34.0)
MCHC: 32.7 g/dL (ref 30.0–36.0)
MCV: 92.2 fL (ref 80.0–100.0)
Platelets: 142 10*3/uL — ABNORMAL LOW (ref 150–400)
RBC: 3.95 MIL/uL (ref 3.87–5.11)
RDW: 22.2 % — ABNORMAL HIGH (ref 11.5–15.5)
WBC: 17.3 10*3/uL — ABNORMAL HIGH (ref 4.0–10.5)
nRBC: 0 % (ref 0.0–0.2)

## 2021-05-03 MED ORDER — IBUPROFEN 600 MG PO TABS: 600.0000 mg | ORAL_TABLET | Freq: Four times a day (QID) | ORAL | 1 refills | Status: DC | PRN

## 2021-05-03 NOTE — Progress Notes (Signed)
Post Partum Day # 1, s/p SVD ? ?Subjective: ?no complaints, up ad lib, voiding, and tolerating PO. Notes her milk has not come in yet, has supplemented with formula as infant was fussy. Feels tired.  ? ?Objective: ?Temp:  [97.5 ?F (36.4 ?C)-98.3 ?F (36.8 ?C)] 98 ?F (36.7 ?C) (04/22 5329) ?Pulse Rate:  [88-109] 108 (04/22 0803) ?Resp:  [18-20] 18 (04/22 0803) ?BP: (108-143)/(65-86) 116/69 (04/22 0803) ?SpO2:  [97 %-100 %] 99 % (04/22 0803) ? ?Physical Exam:  ?General: fatigued and no distress  ?Lungs: clear to auscultation bilaterally ?Breasts: normal appearance, no masses or tenderness ?Heart: regular rate and rhythm, S1, S2 normal, no murmur, click, rub or gallop ?Abdomen: soft, non-tender; bowel sounds normal; no masses,  no organomegaly ?Pelvis: Lochia: appropriate, Uterine Fundus: firm ?Extremities: DVT Evaluation: No evidence of DVT seen on physical exam. Negative Homan's sign. No cords or calf tenderness. ?No significant calf/ankle edema. ? ?Recent Labs  ?  05/02/21 ?9242 05/03/21 ?6834  ?HGB 13.8 11.9*  ?HCT 42.3 36.4  ? ? ?Assessment/Plan: ?Routine postpartum care ?Breastfeeding, Lactation consult ?Contraception: condoms ?Discharge home later today or tomorrow.  ? ? LOS: 1 day  ? ?Rubie Maid, MD ?[facility] ?05/03/2021 11:30 AM ? ? ? ?

## 2021-05-03 NOTE — Discharge Summary (Signed)
? ?  Postpartum Discharge Summary ? ? ?   ?Patient Name: Monica Price ?DOB: 1990/05/11 ?MRN: [id number] ? ?Date of admission: 05/02/2021 ?Delivery date:05/02/2021  ?Delivering provider: Rubie Maid  ?Date of discharge: 05/03/2021 ? ?Admitting diagnosis: Normal labor [O80, Z37.9] ?Intrauterine pregnancy: [redacted]w[redacted]d    ?Secondary diagnosis:  Principal Problem: ?  Normal labor ?Active Problems: ?  Encounter for supervision of normal first pregnancy in third trimester ?  Maternal varicella, non-immune ? ?Additional problems: None    ?Discharge diagnosis: Term Pregnancy Delivered                                              ?Post partum procedures: None ?Augmentation: N/A ?Complications: None ? ?Hospital course: Onset of Labor With Vaginal Delivery      ?31y.o. yo G1P1001 at 34w3das admitted in Latent Labor on 05/02/2021. Patient had an uncomplicated labor course as follows:  ?Membrane Rupture Time/Date: 10:36 AM ,05/02/2021   ?Delivery Method:Vaginal, Spontaneous  ?Episiotomy: None  ?Lacerations:  None  ?Patient had an uncomplicated postpartum course.  She is ambulating, tolerating a regular diet, passing flatus, and urinating well. Patient is discharged home in stable condition on 05/03/21. ? ?Newborn Data: ?Birth date:05/02/2021  ?Birth time:12:45 PM  ?Gender:Female  ?Living status:Living  ?Apgars:6 ,9  ?Weight:3270 g  ? ?Magnesium Sulfate received: No ?BMZ received: No ?Rhophylac:No ?MMR:No ?T-DaP:Given prenatally ?Flu: No ?Transfusion:No ? ?Physical exam  ?Vitals:  ? 05/02/21 2021 05/03/21 0016 05/03/21 0429 05/03/21 0803  ?BP: 139/86 132/79 111/66 116/69  ?Pulse:  (!) 105 92 (!) 108  ?Resp:  20 20 18   ?Temp:  98.1 ?F (36.7 ?C) 98 ?F (36.7 ?C) 98 ?F (36.7 ?C)  ?TempSrc:  Oral Oral Oral  ?SpO2:  99% 100% 99%  ?Weight:      ?Height:      ? ?General: alert and no distress ?Lochia: appropriate ?Uterine Fundus: firm ?Incision: N/A ?DVT Evaluation: No evidence of DVT seen on physical exam. ?Negative Homan's sign. ?No cords or  calf tenderness. ?No significant calf/ankle edema. ?Labs: ?Lab Results  ?Component Value Date  ? WBC 17.3 (H) 05/03/2021  ? HGB 11.9 (L) 05/03/2021  ? HCT 36.4 05/03/2021  ? MCV 92.2 05/03/2021  ? PLT 142 (L) 05/03/2021  ? ? ?Edinburgh Score: ? ?  05/03/2021  ? 11:46 AM  ?EdFlavia Shipperostnatal Depression Scale Screening Tool  ?I have been able to laugh and see the funny side of things. 0  ?I have looked forward with enjoyment to things. 0  ?I have blamed myself unnecessarily when things went wrong. 1  ?I have been anxious or worried for no good reason. 1  ?I have felt scared or panicky for no good reason. 1  ?Things have been getting on top of me. 1  ?I have been so unhappy that I have had difficulty sleeping. 0  ?I have felt sad or miserable. 1  ?I have been so unhappy that I have been crying. 0  ?The thought of harming myself has occurred to me. 0  ?Edinburgh Postnatal Depression Scale Total 5  ? ? ? ?After visit meds:  ?Allergies as of 05/03/2021   ?No Known Allergies ?  ? ?  ?Medication List  ?  ? ?STOP taking these medications   ? ?ferrous sulfate 325 (65 FE) MG tablet ?  ? ?  ? ?TAKE these medications   ? ?  ibuprofen 600 MG tablet ?Commonly known as: ADVIL ?Take 1 tablet (600 mg total) by mouth every 6 (six) hours as needed. ?  ?multivitamin-prenatal 27-0.8 MG Tabs tablet ?Take 1 tablet by mouth daily at 12 noon. ?  ? ?  ? ? ? ?Discharge home in stable condition ?Infant Feeding: Breast ?Infant Disposition:home with mother ?Discharge instruction: per After Visit Summary and Postpartum booklet. ?Activity: Advance as tolerated. Pelvic rest for 6 weeks.  ?Diet: routine diet ?Anticipated Birth Control: Condoms ?Postpartum Appointment:6 weeks ?Additional Postpartum F/U: Postpartum Depression checkup ?Future Appointments: ?Future Appointments  ?Date Time Provider [place]  ?05/07/2021  8:30 AM Rubie Maid, MD [facility] None  ?05/14/2021  1:30 PM [facility] NST ROOM [facility] None  ?05/14/2021  3:30 PM Harlin Heys,  MD [facility] None  ? ?Follow up Visit: ? Follow-up Information   ? ? Rubie Maid, MD Follow up.   ?Specialties: Obstetrics and Gynecology, Radiology ?Why: 2-3 weeks postpartum mood check televisit  ?6 weeks postpartum visit ?Contact information: ?[address] ?[address] ?[place] [address] ?[phone] ? ? ?  ?  ? ?  ?  ? ?  ? ? ? ?  ? ?05/03/2021 ?Rubie Maid, MD ? ? ?

## 2021-05-03 NOTE — Progress Notes (Signed)
Patient discharged with infant. Discharge instructions, prescriptions, and follow up appointments given to and reviewed with patient. Patient verbalized understanding. Escorted out by axillary.  

## 2021-05-07 ENCOUNTER — Encounter: Payer: 59 | Admitting: Obstetrics and Gynecology

## 2021-05-08 NOTE — Anesthesia Postprocedure Evaluation (Signed)
Anesthesia Post Note ? ?Patient: Monica Price ? ?Procedure(s) Performed: AN AD HOC LABOR EPIDURAL ? ?Patient location during evaluation: Mother Baby ?Anesthesia Type: Epidural ?Level of consciousness: awake and oriented ?Pain management: satisfactory to patient ?Vital Signs Assessment: post-procedure vital signs reviewed and stable ?Respiratory status: respiratory function stable ?Cardiovascular status: stable ?Postop Assessment: no headache and no backache ?Anesthetic complications: no ? ? ?No notable events documented. ? ? ?Last Vitals: There were no vitals filed for this visit.  ?Last Pain: There were no vitals filed for this visit. ? ?  ?  ?  ?  ?  ?  ? ?VAN STAVEREN,Taquisha Phung ? ? ? ? ? ?

## 2021-05-13 ENCOUNTER — Inpatient Hospital Stay: Admit: 2021-05-13 | Payer: Self-pay

## 2021-05-14 ENCOUNTER — Telehealth: Payer: 59 | Admitting: Obstetrics and Gynecology

## 2021-05-14 ENCOUNTER — Other Ambulatory Visit: Payer: 59

## 2021-05-14 ENCOUNTER — Encounter: Payer: 59 | Admitting: Obstetrics and Gynecology

## 2021-05-22 ENCOUNTER — Telehealth: Payer: 59 | Admitting: Obstetrics and Gynecology

## 2021-05-22 NOTE — Progress Notes (Deleted)
   Virtual Visit via Video Note  I connected with Monica Price on 05/22/21 at   4:15 PM EDT by a video enabled telemedicine application and verified that I am speaking with the correct person using two identifiers.  Location: Patient: Home Provider: In office   I discussed the limitations of evaluation and management by telemedicine and the availability of in person appointments. The patient expressed understanding and agreed to proceed.    History of Present Illness:   Monica Price is a 31 y.o. G66P1001 female who presents for a 2 week televisit for mood check. She is 2 weeks postpartum following a ***spontaneous vaginal/cesarean delivery.  The delivery was at {Numbers; 0-40:17906} gestational weeks.  Postpartum course has been well so far. Baby is feeding by ***. Bleeding: ***. Postpartum depression screening: {Desc; negative/positive:13464}.  EDPS score is ***.      The following portions of the patient's history were reviewed and updated as appropriate: allergies, current medications, past family history, past medical history, past social history, past surgical history, and problem list.   Observations/Objective:   unknown if currently breastfeeding. Gen App: NAD Psych: normal speech, affect. Good mood.        05/03/2021   11:46 AM  Edinburgh Postnatal Depression Scale Screening Tool  I have been able to laugh and see the funny side of things. 0  I have looked forward with enjoyment to things. 0  I have blamed myself unnecessarily when things went wrong. 1  I have been anxious or worried for no good reason. 1  I have felt scared or panicky for no good reason. 1  Things have been getting on top of me. 1  I have been so unhappy that I have had difficulty sleeping. 0  I have felt sad or miserable. 1  I have been so unhappy that I have been crying. 0  The thought of harming myself has occurred to me. 0  Edinburgh Postnatal Depression Scale Total 5         Assessment  and Plan:   1. Encounter for screening for maternal depression - Screening {FINDINGS; POSITIVE NEGATIVE:7181562292} today. Will rescreen at 6 week postpartum visit. Overall doing well.    2. Postpartum state - Overall doing well. Continue routine postpartum home care.    3. Lactating mother -    Follow Up Instructions:     I discussed the assessment and treatment plan with the patient. The patient was provided an opportunity to ask questions and all were answered. The patient agreed with the plan and demonstrated an understanding of the instructions.   The patient was advised to call back or seek an in-person evaluation if the symptoms worsen or if the condition fails to improve as anticipated.   I provided *** minutes of non-face-to-face time during this encounter.     Rubie Maid, MD [facility]

## 2021-06-04 ENCOUNTER — Telehealth: Payer: 59 | Admitting: Obstetrics and Gynecology

## 2021-06-11 NOTE — Progress Notes (Unsigned)
   OBSTETRICS POSTPARTUM CLINIC PROGRESS NOTE  Subjective:     Monica Price is a 31 y.o. G16P1001 female who presents for a postpartum visit. She is 6 weeks postpartum following a spontaneous vaginal delivery. I have fully reviewed the prenatal and intrapartum course. The delivery was at 38.3 gestational weeks.  Anesthesia: epidural. Postpartum course has been going well, no complications. Baby's course has been good. Baby is feeding by both breast and bottle - Similac Sensitive RS. Bleeding: patient has not not resumed menses, with No LMP recorded. Bowel function is normal. Bladder function is normal. Patient is sexually active. Contraception method desired is IUD. Postpartum depression screening: negative.  EDPS score is 7.    The following portions of the patient's history were reviewed and updated as appropriate: allergies, current medications, past family history, past medical history, past social history, past surgical history, and problem list.  Review of Systems Pertinent items are noted in HPI.   Objective:    BP 120/87   Pulse 79   Resp 16   Ht '5\' 8"'$  (1.727 m)   Wt 193 lb 8 oz (87.8 kg)   Breastfeeding Yes   BMI 29.42 kg/m   General:  alert and no distress   Breasts:  inspection negative, no nipple discharge or bleeding, no masses or nodularity palpable  Lungs: clear to auscultation bilaterally  Heart:  regular rate and rhythm, S1, S2 normal, no murmur, click, rub or gallop  Abdomen: soft, non-tender; bowel sounds normal; no masses,  no organomegaly.  Well healed Pfannenstiel incision   Vulva:  normal  Vagina: normal vagina, no discharge, exudate, lesion, or erythema  Cervix:  no cervical motion tenderness and no lesions  Corpus: normal size, contour, position, consistency, mobility, non-tender  Adnexa:  normal adnexa and no mass, fullness, tenderness  Rectal Exam: Not performed.         Labs:  Lab Results  Component Value Date   HGB 11.9 (L) 05/03/2021     Assessment:   1. Postpartum care following vaginal delivery   2. Lactating mother   3. Encounter for IUD insertion      Plan:   1. Contraception: Mirena IUD, see insertion note below.  2. Breast and formula feeding, no issues.  3. Follow up in:  1 month  for IUD string check.  4-6 months for annual exam.    Rubie Maid, MD [facility]    IUD Insertion Procedure Note Patient identified, informed consent performed, consent signed.   Discussed risks of irregular bleeding, cramping, infection, malpositioning or misplacement of the IUD outside the uterus which may require further procedure such as laparoscopy. Also discussed >99% contraception efficacy, increased risk of ectopic pregnancy with failure of method.   Emphasized that this did not protect against STIs, condoms recommended during all sexual encounters. Time out was performed.  Urine pregnancy test negative.  Speculum placed in the vagina.  Cervix visualized.  Cleaned with Betadine x 2.  Grasped anteriorly with a single tooth tenaculum.  Uterus sounded to 8.5 cm.  Mirena IUD placed per manufacturer's recommendations.  Strings trimmed to 3 cm. Tenaculum was removed, good hemostasis noted.  Patient tolerated procedure well.   Patient was given post-procedure instructions.  She was advised to have backup contraception for one week.  Patient was also asked to check IUD strings periodically and follow up in 4 weeks for IUD check.   Lot: [id number] Exp: 04/2023  Rubie Maid, MD [facility]

## 2021-06-13 ENCOUNTER — Encounter: Payer: Self-pay | Admitting: Obstetrics and Gynecology

## 2021-06-13 ENCOUNTER — Ambulatory Visit (INDEPENDENT_AMBULATORY_CARE_PROVIDER_SITE_OTHER): Payer: 59 | Admitting: Obstetrics and Gynecology

## 2021-06-13 DIAGNOSIS — Z3202 Encounter for pregnancy test, result negative: Secondary | ICD-10-CM

## 2021-06-13 DIAGNOSIS — Z30430 Encounter for insertion of intrauterine contraceptive device: Secondary | ICD-10-CM

## 2021-06-13 LAB — POCT URINE PREGNANCY: Preg Test, Ur: NEGATIVE

## 2021-06-13 NOTE — Patient Instructions (Signed)
IUD PLACEMENT POST-PROCEDURE INSTRUCTIONS  You may take Ibuprofen, Aleve or Tylenol for pain if needed.  Cramping should resolve within in 24 hours.  You may have a small amount of spotting.  You should wear a mini pad for the next few days.  You may have intercourse after 24 hours.  If you using this for birth control, it is effective after 1 week. Use a back up method during this time.  You need to call if you have any pelvic pain, fever, heavy bleeding or foul smelling vaginal discharge.  Irregular bleeding is common the first several months after having an IUD placed. You do not need to call for this reason unless you are concerned.  Shower or bathe as normal  You should have a follow-up appointment in 4-8 weeks for a re-check to make sure you are not having any problems.

## 2021-07-09 ENCOUNTER — Ambulatory Visit (INDEPENDENT_AMBULATORY_CARE_PROVIDER_SITE_OTHER): Payer: 59 | Admitting: Obstetrics and Gynecology

## 2021-07-09 ENCOUNTER — Encounter: Payer: Self-pay | Admitting: Obstetrics and Gynecology

## 2021-07-09 VITALS — BP 114/65 | HR 78 | Resp 16 | Ht 68.0 in | Wt 195.8 lb

## 2021-07-09 DIAGNOSIS — N649 Disorder of breast, unspecified: Secondary | ICD-10-CM | POA: Diagnosis not present

## 2021-07-09 DIAGNOSIS — Z30431 Encounter for routine checking of intrauterine contraceptive device: Secondary | ICD-10-CM | POA: Diagnosis not present

## 2021-07-09 DIAGNOSIS — R300 Dysuria: Secondary | ICD-10-CM | POA: Diagnosis not present

## 2021-07-09 LAB — POCT URINALYSIS DIPSTICK
Bilirubin, UA: NEGATIVE
Glucose, UA: NEGATIVE
Ketones, UA: NEGATIVE
Nitrite, UA: POSITIVE
Protein, UA: POSITIVE — AB
Spec Grav, UA: 1.015 (ref 1.010–1.025)
Urobilinogen, UA: 0.2 E.U./dL
pH, UA: 6.5 (ref 5.0–8.0)

## 2021-07-09 MED ORDER — NITROFURANTOIN MONOHYD MACRO 100 MG PO CAPS: 100.0000 mg | ORAL_CAPSULE | Freq: Two times a day (BID) | ORAL | 1 refills | Status: DC

## 2021-07-09 NOTE — Progress Notes (Signed)
    GYNECOLOGY OFFICE ENCOUNTER NOTE  History:  31 y.o. G1P1001 here today for today for IUD string check; Mirena  IUD was placed  06/13/2021. No complaints about the IUD, no concerning side effects.  Notes that she is still having bleeding that is light.    Patient also complaints of an area on her right breast that is sore and red, has decreased in size over the past few days. Is currently breastfeeding.   Lastly, patient notes burning with urination over the past few days. Has a h/o interstitial cystitis, was unsure if it was related to this.   The following portions of the patient's history were reviewed and updated as appropriate: allergies, current medications, past family history, past medical history, past social history, past surgical history and problem list. Last pap smear on 10/29/2020 was normal, negative HRHPV.  Review of Systems:  Pertinent items are noted in HPI.  Objective:  Physical Exam Blood pressure 114/65, pulse 78, resp. rate 16, height '5\' 8"'$  (1.727 m), weight 195 lb 12.8 oz (88.8 kg), currently breastfeeding.   CONSTITUTIONAL: Well-developed, well-nourished female in no acute distress.  NEUROLOGIC: Alert and oriented to person, place, and time. Normal reflexes, muscle tone coordination.  BREASTS: lactating, no erythema or tenderness, nipples normal, no masses.  Right breast with pinpoint papular lesion, mildly erythemic.  ABDOMEN: Soft, no distention noted.   PELVIC: Normal appearing external genitalia; normal appearing vaginal mucosa and cervix. Small amount of dark red blood in vaginal vault. IUD strings visualized, about 3 cm in length outside cervix. Done in the presence of a chaperone.  EXTREMITIES: Non-tender, no edema or cyanosis.    Labs:  Results for orders placed or performed in visit on 07/09/21  POCT Urinalysis Dipstick  Result Value Ref Range   Color, UA     Clarity, UA     Glucose, UA Negative Negative   Bilirubin, UA Negaive    Ketones, UA  Negative    Spec Grav, UA 1.015 1.010 - 1.025   Blood, UA Large    pH, UA 6.5 5.0 - 8.0   Protein, UA Positive (A) Negative   Urobilinogen, UA 0.2 0.2 or 1.0 E.U./dL   Nitrite, UA Positive    Leukocytes, UA Large (3+) (A) Negative   Appearance     Odor      Assessment & Plan:  1. IUD check up - Patient to keep IUD in place for up to 8 years; can come in for removal if she desires pregnancy earlier or for any concerning side effects. - Advised that if bleeding continues, can trial scheduled NSAIDs for 5-7 days, or short interval of Estradiol supplementation.   2. Dysuria - POCT Urinalysis Dipstick positive for nitrates - Urine Culture - Macrobid 100 mg BID x 7 days  3. Breast lesion - Resolving, likely an area of folliculitis near areola.  No evidence of mastitis.   Return to clinic for any scheduled appointments or for any gynecologic concerns as needed. Needs annual exam in 4-6 months.     Rubie Maid, MD [facility]

## 2021-07-11 ENCOUNTER — Encounter: Payer: 59 | Admitting: Obstetrics and Gynecology

## 2021-07-12 LAB — URINE CULTURE

## 2021-10-21 ENCOUNTER — Ambulatory Visit (INDEPENDENT_AMBULATORY_CARE_PROVIDER_SITE_OTHER): Payer: 59 | Admitting: Family

## 2021-10-21 ENCOUNTER — Encounter: Payer: Self-pay | Admitting: Family

## 2021-10-21 VITALS — BP 118/70 | HR 86 | Temp 98.3°F | Resp 16 | Ht 68.0 in | Wt 187.0 lb

## 2021-10-21 DIAGNOSIS — R1013 Epigastric pain: Secondary | ICD-10-CM

## 2021-10-21 DIAGNOSIS — T781XXA Other adverse food reactions, not elsewhere classified, initial encounter: Secondary | ICD-10-CM | POA: Diagnosis not present

## 2021-10-21 LAB — CBC WITH DIFFERENTIAL/PLATELET
Basophils Absolute: 0.1 10*3/uL (ref 0.0–0.1)
Basophils Relative: 1 % (ref 0.0–3.0)
Eosinophils Absolute: 0.9 10*3/uL — ABNORMAL HIGH (ref 0.0–0.7)
Eosinophils Relative: 14 % — ABNORMAL HIGH (ref 0.0–5.0)
HCT: 43.5 % (ref 36.0–46.0)
Hemoglobin: 15 g/dL (ref 12.0–15.0)
Lymphocytes Relative: 32.2 % (ref 12.0–46.0)
Lymphs Abs: 2.2 10*3/uL (ref 0.7–4.0)
MCHC: 34.4 g/dL (ref 30.0–36.0)
MCV: 92 fl (ref 78.0–100.0)
Monocytes Absolute: 0.4 10*3/uL (ref 0.1–1.0)
Monocytes Relative: 6.7 % (ref 3.0–12.0)
Neutro Abs: 3.1 10*3/uL (ref 1.4–7.7)
Neutrophils Relative %: 46.1 % (ref 43.0–77.0)
Platelets: 204 10*3/uL (ref 150.0–400.0)
RBC: 4.73 Mil/uL (ref 3.87–5.11)
RDW: 12.8 % (ref 11.5–15.5)
WBC: 6.7 10*3/uL (ref 4.0–10.5)

## 2021-10-21 NOTE — Assessment & Plan Note (Signed)
Ordering cbc alpha gal h pylori pending results.

## 2021-10-21 NOTE — Patient Instructions (Signed)
  Stop by the lab prior to leaving today. I will notify you of your results once received.    Regards,   Wenona Mayville FNP-C  

## 2021-10-21 NOTE — Assessment & Plan Note (Signed)
R/o alpha gal pending test ordered today Also suggested low FODMAP diet for suspected IBS  Printout sent to pt mychart for pt to follow for low fodmap Ordering celiac panel to r/o

## 2021-10-21 NOTE — Progress Notes (Signed)
Established Patient Office Visit  Subjective:  Patient ID: Monica Price, female    DOB: Oct 02, 1990  Age: 31 y.o. MRN: [id number]  CC:  Chief Complaint  Patient presents with   GI Problem    X 3 months   Diarrhea    X 3 months    HPI Monica Price is here today with concerns.   Over the last three months with abdominal discomfort and diarrhea. This seems to wax and wane. When she is experiencing diarrhea she experiences this 2-3 times daily. Slight nausea at times, no vomiting. No blood in stool. Sometimes will alternate with constipation but is intermittent. Suspects this may be possible IBS based off of symptoms.   Some increased stressors.  Does report abdominal pain when the symptoms occur, doesn't feel like cramping, more dull and aching and constant when it occurs. No urinary symptoms.   No known IBD or IBS in the family no autoimmune disease in the family.  No mucous in stool.  Doesn't burp often.  No increased flatulence.  Does note some increased sensitivity to beef and steak.   She is not currently breastfeeding.   Past Medical History:  Diagnosis Date   Anemia    GERD (gastroesophageal reflux disease)    Interstitial cystitis    Microscopic hematuria    Recurrent UTI    Pt has been treated for kidney infection and UTI for 10 months    Past Surgical History:  Procedure Laterality Date   CYSTO WITH HYDRODISTENSION N/A 02/08/2018   Procedure: CYSTOSCOPY/HYDRODISTENSION;  Surgeon: Royston Cowper, MD;  Location: [facility];  Service: Urology;  Laterality: N/A;   WISDOM TOOTH EXTRACTION      Family History  Problem Relation Age of Onset   Healthy Mother    Healthy Father    Bladder Cancer Maternal Aunt        great   Bladder Cancer Maternal Uncle        great   Kidney Stones Maternal Grandmother    Throat cancer Maternal Grandfather    Kidney disease Neg Hx    Prostate cancer Neg Hx    Kidney cancer Neg Hx    Ovarian cancer Neg Hx    Breast cancer  Neg Hx     Social History   Socioeconomic History   Marital status: Married    Spouse name: Not on file   Number of children: Not on file   Years of education: Not on file   Highest education level: Not on file  Occupational History   Not on file  Tobacco Use   Smoking status: Never   Smokeless tobacco: Never  Vaping Use   Vaping Use: Never used  Substance and Sexual Activity   Alcohol use: Not Currently    Comment: RARE   Drug use: Never   Sexual activity: Yes    Birth control/protection: None  Other Topics Concern   Not on file  Social History Narrative   ** Merged History Encounter **       Social Determinants of Health   Financial Resource Strain: Not on file  Food Insecurity: Not on file  Transportation Needs: Not on file  Physical Activity: Not on file  Stress: Not on file  Social Connections: Not on file  Intimate Partner Violence: Not on file    Outpatient Medications Prior to Visit  Medication Sig Dispense Refill   nitrofurantoin, macrocrystal-monohydrate, (MACROBID) 100 MG capsule Take 1 capsule (100 mg total) by mouth 2 (two)  times daily. (Patient not taking: Reported on 10/21/2021) 14 capsule 1   Prenatal Vit-Fe Fumarate-FA (MULTIVITAMIN-PRENATAL) 27-0.8 MG TABS tablet Take 1 tablet by mouth daily at 12 noon. (Patient not taking: Reported on 10/21/2021)     No facility-administered medications prior to visit.    No Known Allergies      Objective:    Physical Exam Constitutional:      General: She is not in acute distress.    Appearance: Normal appearance. She is obese. She is not ill-appearing, toxic-appearing or diaphoretic.  Cardiovascular:     Rate and Rhythm: Normal rate and regular rhythm.  Pulmonary:     Effort: Pulmonary effort is normal.  Abdominal:     General: Abdomen is flat. Bowel sounds are normal.     Palpations: Abdomen is soft. There is no mass.     Tenderness: There is abdominal tenderness in the epigastric area. Negative  signs include McBurney's sign.     Hernia: No hernia is present.  Neurological:     Mental Status: She is alert.     BP 118/70   Pulse 86   Temp 98.3 F (36.8 C)   Resp 16   Ht '5\' 8"'$  (1.727 m)   Wt 187 lb (84.8 kg)   LMP  (LMP Unknown)   SpO2 96%   Breastfeeding No   BMI 28.43 kg/m  Wt Readings from Last 3 Encounters:  10/21/21 187 lb (84.8 kg)  07/09/21 195 lb 12.8 oz (88.8 kg)  06/13/21 193 lb 8 oz (87.8 kg)     Health Maintenance Due  Topic Date Due   COVID-19 Vaccine (1) Never done   INFLUENZA VACCINE  Never done    There are no preventive care reminders to display for this patient.  Lab Results  Component Value Date   TSH 0.798 10/25/2019   Lab Results  Component Value Date   WBC 17.3 (H) 05/03/2021   HGB 11.9 (L) 05/03/2021   HCT 36.4 05/03/2021   MCV 92.2 05/03/2021   PLT 142 (L) 05/03/2021   Lab Results  Component Value Date   NA 138 10/25/2019   K 4.4 10/25/2019   CO2 25 10/25/2019   GLUCOSE 82 10/25/2019   BUN 10 10/25/2019   CREATININE 0.80 10/25/2019   BILITOT 0.3 10/25/2019   ALKPHOS 53 10/25/2019   AST 14 10/25/2019   ALT 7 10/25/2019   PROT 6.7 10/25/2019   ALBUMIN 4.6 10/25/2019   CALCIUM 9.2 10/25/2019   ANIONGAP 5 04/24/2016   No results found for: "HGBA1C"    Assessment & Plan:   Problem List Items Addressed This Visit       Other   Epigastric pain - Primary    Ordering cbc alpha gal h pylori pending results.      Relevant Orders   Giardia antigen   C. difficile GDH and Toxin A/B   Gastrointestinal Pathogen Pnl RT, PCR   H. pylori breath test   Alpha-Gal Panel   Celiac Pnl 2 rflx Endomysial Ab Ttr   CALPROTECTIN   CBC with Differential   Gastrointestinal food sensitivity    R/o alpha gal pending test ordered today Also suggested low FODMAP diet for suspected IBS  Printout sent to pt mychart for pt to follow for low fodmap Ordering celiac panel to r/o       Relevant Orders   Giardia antigen   C. difficile  GDH and Toxin A/B   Gastrointestinal Pathogen Pnl RT, PCR   H.  pylori breath test   Alpha-Gal Panel   Celiac Pnl 2 rflx Endomysial Ab Ttr   CALPROTECTIN   CBC with Differential    No orders of the defined types were placed in this encounter.   Follow-up: No follow-ups on file.    Eugenia Pancoast, FNP

## 2021-10-21 NOTE — Addendum Note (Signed)
Addended by: Ellamae Sia on: 10/21/2021 01:30 PM   Modules accepted: Orders

## 2021-10-21 NOTE — Progress Notes (Signed)
Monica Price elevated, pending results of GI stool panel as might be something GI related. If negative, will do more of a workup. Eosinophils typically elevated with allergic reaction or inflammation either parasitic or gi related. Can also be environmental allergies however less likely given pt's current symptoms  -Monica Price. Saw your very nice pt today, pending workup for celiac and calprotectin as well as stool cards for GI upset ongoing for two months or so.

## 2021-10-22 ENCOUNTER — Telehealth: Payer: Self-pay | Admitting: *Deleted

## 2021-10-22 ENCOUNTER — Other Ambulatory Visit: Payer: Self-pay | Admitting: Family

## 2021-10-22 DIAGNOSIS — R198 Other specified symptoms and signs involving the digestive system and abdomen: Secondary | ICD-10-CM

## 2021-10-22 DIAGNOSIS — T781XXA Other adverse food reactions, not elsewhere classified, initial encounter: Secondary | ICD-10-CM

## 2021-10-22 DIAGNOSIS — R195 Other fecal abnormalities: Secondary | ICD-10-CM

## 2021-10-22 DIAGNOSIS — R1013 Epigastric pain: Secondary | ICD-10-CM

## 2021-10-22 LAB — FECAL OCCULT BLOOD, IMMUNOCHEMICAL: Fecal Occult Bld: POSITIVE — AB

## 2021-10-22 NOTE — Progress Notes (Signed)
Reviewed,  Pt being called and notified by Ma.  Referral placed for GI.   Forde Radon. Possibility of workup for IBD vs ibs

## 2021-10-22 NOTE — Telephone Encounter (Signed)
Information relayed to Eugenia Pancoast NP in person regarding positive results.

## 2021-10-22 NOTE — Progress Notes (Signed)
Received results for positive blood in stool from recent test.  Ask if any hemorrhoids?  Referring her to GI as well to rule out anything such as irritable bowel disease with current symptoms.  Awaiting results of other tests too

## 2021-10-22 NOTE — Progress Notes (Signed)
Called pt but vm is full unable to leave a message.  

## 2021-10-22 NOTE — Telephone Encounter (Signed)
This has been addressed.

## 2021-10-22 NOTE — Telephone Encounter (Signed)
Monica Price with [facility] called stating that patient's IFob is positive.

## 2021-10-27 ENCOUNTER — Other Ambulatory Visit: Payer: Self-pay | Admitting: Family

## 2021-10-27 DIAGNOSIS — D721 Eosinophilia, unspecified: Secondary | ICD-10-CM

## 2021-10-27 LAB — CALPROTECTIN: Calprotectin: 86 mcg/g

## 2021-10-27 LAB — H. PYLORI BREATH TEST: H. pylori Breath Test: NOT DETECTED

## 2021-10-28 ENCOUNTER — Ambulatory Visit (INDEPENDENT_AMBULATORY_CARE_PROVIDER_SITE_OTHER): Payer: 59 | Admitting: Obstetrics and Gynecology

## 2021-10-28 ENCOUNTER — Encounter: Payer: Self-pay | Admitting: Obstetrics and Gynecology

## 2021-10-28 VITALS — BP 114/92 | HR 78 | Resp 16 | Ht 68.0 in | Wt 187.4 lb

## 2021-10-28 DIAGNOSIS — R1030 Lower abdominal pain, unspecified: Secondary | ICD-10-CM

## 2021-10-28 DIAGNOSIS — Z975 Presence of (intrauterine) contraceptive device: Secondary | ICD-10-CM | POA: Diagnosis not present

## 2021-10-28 DIAGNOSIS — Z01419 Encounter for gynecological examination (general) (routine) without abnormal findings: Secondary | ICD-10-CM

## 2021-10-28 NOTE — Progress Notes (Signed)
GYNECOLOGY ANNUAL PHYSICAL EXAM PROGRESS NOTE  Subjective:    Monica Price is a 31 y.o. G61P1001 female who presents for an annual exam. The patient is sexually active. The patient participates in regular exercise: yes. Has the patient ever been transfused or tattooed?: no. The patient reports that there is not domestic violence in her life.   The patient has the following complaints today.  She has been having some abdominal pain and diarrhea. Seen by her PCP, had labs done and fecal occult blood test was positive. She has an appointment to see Gastroenterology for her symptoms later this week. She thinks it may be stress related or IBS.     Menstrual History: Menarche age: 37 No LMP recorded (lmp unknown). (Menstrual status: IUD).     Gynecologic History:  Contraception: IUD (inserted  History of STI's: Denies Last Pap: 10/29/2020. Results were: normal.  Denies h/o abnormal pap smears     Upstream - 10/28/21 1113       Pregnancy Intention Screening   Does the patient want to become pregnant in the next year? No    Does the patient's partner want to become pregnant in the next year? No    Would the patient like to discuss contraceptive options today? No      Contraception Wrap Up   Current Method IUD or IUS    End Method IUD or IUS    Contraception Counseling Provided No            The pregnancy intention screening data noted above was reviewed. Potential methods of contraception were discussed. The patient elected to proceed with IUD or IUS.   OB History  Gravida Para Term Preterm AB Living  '1 1 1 '$ 0 0 1  SAB IAB Ectopic Multiple Live Births  0 0 0 0 1    # Outcome Date GA Lbr Len/2nd Weight Sex Delivery Anes PTL Lv  1 Term 05/02/21 [redacted]w[redacted]d/ 00:55 7 lb 3.3 oz (3.27 kg) F Vag-Spont EPI  LIV     Name: Price,GIRL Monica     Apgar1: 6  Apgar5: 9    Past Medical History:  Diagnosis Date   Anemia    GERD (gastroesophageal reflux disease)    Interstitial  cystitis    Microscopic hematuria    Recurrent UTI    Pt has been treated for kidney infection and UTI for 10 months    Past Surgical History:  Procedure Laterality Date   CYSTO WITH HYDRODISTENSION N/A 02/08/2018   Procedure: CYSTOSCOPY/HYDRODISTENSION;  Surgeon: WRoyston Cowper MD;  Location: [facility];  Service: Urology;  Laterality: N/A;   WISDOM TOOTH EXTRACTION      Family History  Problem Relation Age of Onset   Healthy Mother    Healthy Father    Bladder Cancer Maternal Aunt        great   Bladder Cancer Maternal Uncle        great   Kidney Stones Maternal Grandmother    Throat cancer Maternal Grandfather    Kidney disease Neg Hx    Prostate cancer Neg Hx    Kidney cancer Neg Hx    Ovarian cancer Neg Hx    Breast cancer Neg Hx     Social History   Socioeconomic History   Marital status: Married    Spouse name: Not on file   Number of children: Not on file   Years of education: Not on file   Highest education level: Not  on file  Occupational History   Not on file  Tobacco Use   Smoking status: Never   Smokeless tobacco: Never  Vaping Use   Vaping Use: Never used  Substance and Sexual Activity   Alcohol use: Not Currently    Comment: RARE   Drug use: Never   Sexual activity: Yes    Birth control/protection: None  Other Topics Concern   Not on file  Social History Narrative   ** Merged History Encounter **       Social Determinants of Health   Financial Resource Strain: Not on file  Food Insecurity: Not on file  Transportation Needs: Not on file  Physical Activity: Not on file  Stress: Not on file  Social Connections: Not on file  Intimate Partner Violence: Not on file    No current outpatient medications on file prior to visit.   No current facility-administered medications on file prior to visit.    No Known Allergies   Review of Systems Constitutional: negative for chills, fatigue, fevers and sweats Eyes: negative for irritation,  redness and visual disturbance Ears, nose, mouth, throat, and face: negative for hearing loss, nasal congestion, snoring and tinnitus Respiratory: negative for asthma, cough, sputum Cardiovascular: negative for chest pain, dyspnea, exertional chest pressure/discomfort, irregular heart beat, palpitations and syncope Gastrointestinal: negative for abdominal pain, change in bowel habits, nausea and vomiting Genitourinary: negative for abnormal menstrual periods, genital lesions, sexual problems and vaginal discharge, dysuria and urinary incontinence Integument/breast: negative for breast lump, breast tenderness and nipple discharge Hematologic/lymphatic: negative for bleeding and easy bruising Musculoskeletal:negative for back pain and muscle weakness Neurological: negative for dizziness, headaches, vertigo and weakness Endocrine: negative for diabetic symptoms including polydipsia, polyuria and skin dryness Allergic/Immunologic: negative for hay fever and urticaria      Objective:  Blood pressure (!) 114/92, pulse 78, resp. rate 16, height '5\' 8"'$  (1.727 m), weight 187 lb 6.4 oz (85 kg), not currently breastfeeding. Body mass index is 28.49 kg/m.   General Appearance:    Alert, cooperative, no distress, appears stated age  Head:    Normocephalic, without obvious abnormality, atraumatic  Eyes:    PERRL, conjunctiva/corneas clear, EOM's intact, both eyes  Ears:    Normal external ear canals, both ears  Nose:   Nares normal, septum midline, mucosa normal, no drainage or sinus tenderness  Throat:   Lips, mucosa, and tongue normal; teeth and gums normal  Neck:   Supple, symmetrical, trachea midline, no adenopathy; thyroid: no enlargement/tenderness/nodules; no carotid bruit or JVD  Back:     Symmetric, no curvature, ROM normal, no CVA tenderness  Lungs:     Clear to auscultation bilaterally, respirations unlabored  Chest Wall:    No tenderness or deformity   Heart:    Regular rate and rhythm, S1  and S2 normal, no murmur, rub or gallop  Breast Exam:    No tenderness, masses, or nipple abnormality  Abdomen:     Soft, non-tender, bowel sounds active all four quadrants, no masses, no organomegaly.    Genitalia:    Pelvic:external genitalia normal, vagina without lesions, discharge, or tenderness, rectovaginal septum  normal. Cervix normal in appearance, no cervical motion tenderness, IUD threads visualized, 2 cm in length. No adnexal masses or tenderness.  Uterus normal size, shape, mobile, regular contours, nontender.  Rectal:    Normal external sphincter.  No hemorrhoids appreciated. Internal exam not done.   Extremities:   Extremities normal, atraumatic, no cyanosis or edema  Pulses:  2+ and symmetric all extremities  Skin:   Skin color, texture, turgor normal, no rashes or lesions  Lymph nodes:   Cervical, supraclavicular, and axillary nodes normal  Neurologic:   CNII-XII intact, normal strength, sensation and reflexes throughout   .  Labs:  Lab Results  Component Value Date   WBC 6.7 10/21/2021   HGB 15.0 10/21/2021   HCT 43.5 10/21/2021   MCV 92.0 10/21/2021   PLT 204.0 10/21/2021    Lab Results  Component Value Date   CREATININE 0.80 10/25/2019   BUN 10 10/25/2019   NA 138 10/25/2019   K 4.4 10/25/2019   CL 102 10/25/2019   CO2 25 10/25/2019    Lab Results  Component Value Date   ALT 7 10/25/2019   AST 14 10/25/2019   ALKPHOS 53 10/25/2019   BILITOT 0.3 10/25/2019    Lab Results  Component Value Date   TSH 0.798 10/25/2019     Assessment:   1. Encounter for well woman exam with routine gynecological exam   2. IUD (intrauterine device) in place   3. Lower abdominal pain      Plan:  Blood tests: none ordered, up to date. Breast self exam technique reviewed and patient encouraged to perform self-exam monthly. Contraception: IUD. Can maintain for 8 years.  Discussed healthy lifestyle modifications. Mammogram  : Not age appropriate Pap smear  UTD  . COVID vaccination status: declined Flu vaccine: declined.  Follow up in 1 year for annual exam   Rubie Maid, MD [facility]

## 2021-10-28 NOTE — Patient Instructions (Signed)
Breast Self-Awareness Breast self-awareness is knowing how your breasts look and feel. You need to: Check your breasts on a regular basis. Tell your doctor about any changes. Become familiar with the look and feel of your breasts. This can help you catch a breast problem while it is still small and can be treated. You should do breast self-exams even if you have breast implants. What you need: A mirror. A well-lit room. A pillow or other soft object. How to do a breast self-exam Follow these steps to do a breast self-exam: Look for changes  Take off all the clothes above your waist. Stand in front of a mirror in a room with good lighting. Put your hands down at your sides. Compare your breasts in the mirror. Look for any difference between them, such as: A difference in shape. A difference in size. Wrinkles, dips, and bumps in one breast and not the other. Look at each breast for changes in the skin, such as: Redness. Scaly areas. Skin that has gotten thicker. Dimpling. Open sores (ulcers). Look for changes in your nipples, such as: Fluid coming out of a nipple. Fluid around a nipple. Bleeding. Dimpling. Redness. A nipple that looks pushed in (retracted), or that has changed position. Feel for changes Lie on your back. Feel each breast. To do this: Pick a breast to feel. Place a pillow under the shoulder closest to that breast. Put the arm closest to that breast behind your head. Feel the nipple area of that breast using the hand of your other arm. Feel the area with the pads of your three middle fingers by making small circles with your fingers. Use light, medium, and firm pressure. Continue the overlapping circles, moving downward over the breast. Keep making circles with your fingers. Stop when you feel your ribs. Start making circles with your fingers again, this time going upward until you reach your collarbone. Then, make circles outward across your breast and into your  armpit area. Squeeze your nipple. Check for discharge and lumps. Repeat these steps to check your other breast. Sit or stand in the tub or shower. With soapy water on your skin, feel each breast the same way you did when you were lying down. Write down what you find Writing down what you find can help you remember what to tell your doctor. Write down: What is normal for each breast. Any changes you find in each breast. These include: The kind of changes you find. A tender or painful breast. Any lump you find. Write down its size and where it is. When you last had your monthly period (menstrual cycle). General tips If you are breastfeeding, the best time to check your breasts is after you feed your baby or after you use a breast pump. If you get monthly bleeding, the best time to check your breasts is 5-7 days after your monthly cycle ends. With time, you will become comfortable with the self-exam. You will also start to know if there are changes in your breasts. Contact a doctor if: You see a change in the shape or size of your breasts or nipples. You see a change in the skin of your breast or nipples, such as red or scaly skin. You have fluid coming from your nipples that is not normal. You find a new lump or thick area. You have breast pain. You have any concerns about your breast health. Summary Breast self-awareness includes looking for changes in your breasts and feeling for changes   within your breasts. You should do breast self-awareness in front of a mirror in a well-lit room. If you get monthly periods (menstrual cycles), the best time to check your breasts is 5-7 days after your period ends. Tell your doctor about any changes you see in your breasts. Changes include changes in size, changes on the skin, painful or tender breasts, or fluid from your nipples that is not normal. This information is not intended to replace advice given to you by your health care provider. Make sure  you discuss any questions you have with your health care provider. Document Revised: 10/31/2020 Document Reviewed: 10/31/2020 Elsevier Patient Education  2023 Elsevier Inc. Preventive Care 21-39 Years Old, Female Preventive care refers to lifestyle choices and visits with your health care provider that can promote health and wellness. Preventive care visits are also called wellness exams. What can I expect for my preventive care visit? Counseling During your preventive care visit, your health care provider may ask about your: Medical history, including: Past medical problems. Family medical history. Pregnancy history. Current health, including: Menstrual cycle. Method of birth control. Emotional well-being. Home life and relationship well-being. Sexual activity and sexual health. Lifestyle, including: Alcohol, nicotine or tobacco, and drug use. Access to firearms. Diet, exercise, and sleep habits. Work and work environment. Sunscreen use. Safety issues such as seatbelt and bike helmet use. Physical exam Your health care provider may check your: Height and weight. These may be used to calculate your BMI (body mass index). BMI is a measurement that tells if you are at a healthy weight. Waist circumference. This measures the distance around your waistline. This measurement also tells if you are at a healthy weight and may help predict your risk of certain diseases, such as type 2 diabetes and high blood pressure. Heart rate and blood pressure. Body temperature. Skin for abnormal spots. What immunizations do I need?  Vaccines are usually given at various ages, according to a schedule. Your health care provider will recommend vaccines for you based on your age, medical history, and lifestyle or other factors, such as travel or where you work. What tests do I need? Screening Your health care provider may recommend screening tests for certain conditions. This may include: Pelvic exam  and Pap test. Lipid and cholesterol levels. Diabetes screening. This is done by checking your blood sugar (glucose) after you have not eaten for a while (fasting). Hepatitis B test. Hepatitis C test. HIV (human immunodeficiency virus) test. STI (sexually transmitted infection) testing, if you are at risk. BRCA-related cancer screening. This may be done if you have a family history of breast, ovarian, tubal, or peritoneal cancers. Talk with your health care provider about your test results, treatment options, and if necessary, the need for more tests. Follow these instructions at home: Eating and drinking  Eat a healthy diet that includes fresh fruits and vegetables, whole grains, lean protein, and low-fat dairy products. Take vitamin and mineral supplements as recommended by your health care provider. Do not drink alcohol if: Your health care provider tells you not to drink. You are pregnant, may be pregnant, or are planning to become pregnant. If you drink alcohol: Limit how much you have to 0-1 drink a day. Know how much alcohol is in your drink. In the [place] one drink equals one 12 oz bottle of beer (355 mL), one 5 oz glass of wine (148 mL), or one 1 oz glass of hard liquor (44 mL). Lifestyle Brush your teeth every morning and   night with fluoride toothpaste. Floss one time each day. Exercise for at least 30 minutes 5 or more days each week. Do not use any products that contain nicotine or tobacco. These products include cigarettes, chewing tobacco, and vaping devices, such as e-cigarettes. If you need help quitting, ask your health care provider. Do not use drugs. If you are sexually active, practice safe sex. Use a condom or other form of protection to prevent STIs. If you do not wish to become pregnant, use a form of birth control. If you plan to become pregnant, see your health care provider for a prepregnancy visit. Find healthy ways to manage stress, such as: Meditation, yoga, or  listening to music. Journaling. Talking to a trusted person. Spending time with friends and family. Minimize exposure to UV radiation to reduce your risk of skin cancer. Safety Always wear your seat belt while driving or riding in a vehicle. Do not drive: If you have been drinking alcohol. Do not ride with someone who has been drinking. If you have been using any mind-altering substances or drugs. While texting. When you are tired or distracted. Wear a helmet and other protective equipment during sports activities. If you have firearms in your house, make sure you follow all gun safety procedures. Seek help if you have been physically or sexually abused. What's next? Go to your health care provider once a year for an annual wellness visit. Ask your health care provider how often you should have your eyes and teeth checked. Stay up to date on all vaccines. This information is not intended to replace advice given to you by your health care provider. Make sure you discuss any questions you have with your health care provider. Document Revised: 06/26/2020 Document Reviewed: 06/26/2020 Elsevier Patient Education  2023 Elsevier Inc.  

## 2021-10-28 NOTE — Progress Notes (Signed)
Pt has an appt with GI on 10/19

## 2021-10-30 ENCOUNTER — Ambulatory Visit: Payer: 59 | Admitting: Gastroenterology

## 2021-10-30 LAB — CELIAC PNL 2 RFLX ENDOMYSIAL AB TTR
(tTG) Ab, IgA: <1 U/mL
(tTG) Ab, IgG: <1 U/mL
Endomysial Ab IgA: NEGATIVE
Gliadin IgA: 16.8 U/mL — ABNORMAL HIGH
Gliadin IgG: <1 U/mL
Immunoglobulin A: 114 mg/dL (ref 47–310)

## 2021-10-30 LAB — ALPHA-GAL PANEL
Allergen, Mutton, f88: <0.1 kU/L
Allergen, Pork, f26: <0.1 kU/L
Beef: <0.1 kU/L
CLASS: 0
CLASS: 0
Class: 0
GALACTOSE-ALPHA-1,3-GALACTOSE IGE*: <0.1 kU/L (ref <0.10)

## 2021-10-30 LAB — INTERPRETATION:

## 2021-10-31 ENCOUNTER — Encounter: Payer: 59 | Admitting: Obstetrics and Gynecology

## 2021-11-17 ENCOUNTER — Ambulatory Visit: Payer: 59 | Admitting: Gastroenterology

## 2021-12-15 ENCOUNTER — Other Ambulatory Visit: Payer: Self-pay

## 2021-12-15 ENCOUNTER — Encounter: Payer: Self-pay | Admitting: Gastroenterology

## 2021-12-15 ENCOUNTER — Ambulatory Visit (INDEPENDENT_AMBULATORY_CARE_PROVIDER_SITE_OTHER): Payer: 59 | Admitting: Gastroenterology

## 2021-12-15 VITALS — BP 121/81 | HR 71 | Temp 98.1°F | Wt 182.6 lb

## 2021-12-15 DIAGNOSIS — R894 Abnormal immunological findings in specimens from other organs, systems and tissues: Secondary | ICD-10-CM

## 2021-12-15 DIAGNOSIS — R195 Other fecal abnormalities: Secondary | ICD-10-CM | POA: Diagnosis not present

## 2021-12-15 MED ORDER — NA SULFATE-K SULFATE-MG SULF 17.5-3.13-1.6 GM/177ML PO SOLN: 354.0000 mL | Freq: Once | ORAL | 0 refills | Status: AC

## 2021-12-15 NOTE — Progress Notes (Signed)
Jonathon Bellows MD, MRCP(U.K) [address]  [place]  [place], [place] [address]  Main: [phone]  Fax: [phone]   Gastroenterology Consultation  Referring Provider:     Eugenia Pancoast, FNP Primary Care Physician:  Pleas Koch, NP Primary Gastroenterologist:  Dr. Jonathon Bellows  Reason for Consultation:     Positive fit test        HPI:   Monica Price is a 31 y.o. y/o female referred for consultation & management  by  Pleas Koch, NP.    She has been referred for fecal occult blood test that is positive.  Checked in October 2023.  At the same time a fecal calprotectin was negative, hemoglobin was 15 g celiac serology showed an elevated gliadin antibody at 16.8 alpha gal panel was negative H. pylori breath test was negative.  She was seen at that time for epigastric discomfort  She says at the time of her testing she had some abdominal discomfort and diarrhea hence was tested presently has no GI symptoms.  Denies any celiac disease in the family no change in bowel habits no blood in the stool.  Past Medical History:  Diagnosis Date   Anemia    GERD (gastroesophageal reflux disease)    Interstitial cystitis    Microscopic hematuria    Recurrent UTI    Pt has been treated for kidney infection and UTI for 10 months    Past Surgical History:  Procedure Laterality Date   CYSTO WITH HYDRODISTENSION N/A 02/08/2018   Procedure: CYSTOSCOPY/HYDRODISTENSION;  Surgeon: Royston Cowper, MD;  Location: [facility] ORS;  Service: Urology;  Laterality: N/A;   WISDOM TOOTH EXTRACTION      Prior to Admission medications   Medication Sig Start Date End Date Taking? Authorizing Provider  levonorgestrel (MIRENA) 20 MCG/DAY IUD 1 each by Intrauterine route once.   Yes [provider]    Family History  Problem Relation Age of Onset   Healthy Mother    Healthy Father    Bladder Cancer Maternal Aunt        great   Bladder Cancer Maternal Uncle        great    Kidney Stones Maternal Grandmother    Throat cancer Maternal Grandfather    Kidney disease Neg Hx    Prostate cancer Neg Hx    Kidney cancer Neg Hx    Ovarian cancer Neg Hx    Breast cancer Neg Hx      Social History   Tobacco Use   Smoking status: Never   Smokeless tobacco: Never  Vaping Use   Vaping Use: Never used  Substance Use Topics   Alcohol use: Not Currently    Comment: RARE   Drug use: Never    Allergies as of 12/15/2021   (No Known Allergies)    Review of Systems:    All systems reviewed and negative except where noted in HPI.   Physical Exam:  BP 121/81   Pulse 71   Temp 98.1 F (36.7 C) (Oral)   Wt 182 lb 9.6 oz (82.8 kg)   BMI 27.76 kg/m  No LMP recorded. (Menstrual status: IUD). Psych:  Alert and cooperative. Normal mood and affect. General:   Alert,  Well-developed, well-nourished, pleasant and cooperative in NAD Head:  Normocephalic and atraumatic. Eyes:  Sclera clear, no icterus.   Conjunctiva pink.  Neurologic:  Alert and oriented x3;  grossly normal neurologically. Psych:  Alert and cooperative. Normal mood and affect.  Imaging Studies: No results found.  Assessment and Plan:   Monica Price is a 31 y.o. y/o female has been referred for stool occult test positive and a positive celiac serology.  I explained to her that stool occult testing is usually done for colon cancer screening and usually not recommended for any other form of evaluation.  Unfortunately since the test has been done and is positive we will need to proceed with a colonoscopy to rule out any abnormalities.  Since she has celiac serology that is positive we will need to perform an upper endoscopy at the same time to rule out celiac disease.  Since she has no GI symptoms at this time no further evaluation will be conducted.  Her previous white cell count was within normal limits.  She wishes to have a procedure scheduled in January   I have discussed alternative options, risks  & benefits,  which include, but are not limited to, bleeding, infection, perforation,respiratory complication & drug reaction.  The patient agrees with this plan & written consent will be obtained.       Follow up in as needed  Dr Jonathon Bellows MD,MRCP(U.K)

## 2021-12-15 NOTE — Addendum Note (Signed)
Addended by: Wayna Chalet on: 12/15/2021 03:41 PM   Modules accepted: Orders

## 2021-12-30 ENCOUNTER — Telehealth: Payer: 59 | Admitting: Physician Assistant

## 2021-12-30 DIAGNOSIS — B9689 Other specified bacterial agents as the cause of diseases classified elsewhere: Secondary | ICD-10-CM

## 2021-12-30 DIAGNOSIS — J019 Acute sinusitis, unspecified: Secondary | ICD-10-CM | POA: Diagnosis not present

## 2021-12-30 MED ORDER — AMOXICILLIN-POT CLAVULANATE 875-125 MG PO TABS: 1.0000 | ORAL_TABLET | Freq: Two times a day (BID) | ORAL | 0 refills | Status: DC

## 2021-12-30 NOTE — Progress Notes (Signed)

## 2021-12-30 NOTE — Progress Notes (Signed)
I have spent 5 minutes in review of e-visit questionnaire, review and updating patient chart, medical decision making and response to patient.   Monica Price Monica Natiya Seelinger, PA-C    

## 2022-01-21 ENCOUNTER — Ambulatory Visit: Payer: 59 | Admitting: Certified Registered"

## 2022-01-21 ENCOUNTER — Encounter
Admission: RE | Disposition: A | Discharge status: Home / Self Care | Payer: Self-pay | Source: Ambulatory Visit | Attending: Gastroenterology

## 2022-01-21 ENCOUNTER — Ambulatory Visit
Admission: RE | Admit: 2022-01-21 | Discharge: 2022-01-21 | Disposition: A | Discharge status: Home / Self Care | Payer: 59 | Source: Ambulatory Visit | Attending: Gastroenterology | Admitting: Gastroenterology

## 2022-01-21 DIAGNOSIS — R894 Abnormal immunological findings in specimens from other organs, systems and tissues: Secondary | ICD-10-CM

## 2022-01-21 DIAGNOSIS — K219 Gastro-esophageal reflux disease without esophagitis: Secondary | ICD-10-CM | POA: Insufficient documentation

## 2022-01-21 DIAGNOSIS — D649 Anemia, unspecified: Secondary | ICD-10-CM | POA: Diagnosis not present

## 2022-01-21 DIAGNOSIS — K640 First degree hemorrhoids: Secondary | ICD-10-CM | POA: Insufficient documentation

## 2022-01-21 DIAGNOSIS — Z87440 Personal history of urinary (tract) infections: Secondary | ICD-10-CM | POA: Insufficient documentation

## 2022-01-21 DIAGNOSIS — R195 Other fecal abnormalities: Secondary | ICD-10-CM | POA: Diagnosis not present

## 2022-01-21 DIAGNOSIS — D126 Benign neoplasm of colon, unspecified: Secondary | ICD-10-CM

## 2022-01-21 DIAGNOSIS — D125 Benign neoplasm of sigmoid colon: Secondary | ICD-10-CM | POA: Insufficient documentation

## 2022-01-21 DIAGNOSIS — K635 Polyp of colon: Secondary | ICD-10-CM | POA: Diagnosis not present

## 2022-01-21 DIAGNOSIS — R768 Other specified abnormal immunological findings in serum: Secondary | ICD-10-CM | POA: Insufficient documentation

## 2022-01-21 HISTORY — PX: COLONOSCOPY WITH PROPOFOL: SHX5780

## 2022-01-21 HISTORY — PX: ESOPHAGOGASTRODUODENOSCOPY: SHX5428

## 2022-01-21 SURGERY — COLONOSCOPY WITH PROPOFOL
Anesthesia: General

## 2022-01-21 MED ORDER — GLYCOPYRROLATE 0.2 MG/ML IJ SOLN
INTRAMUSCULAR | Status: AC
Start: 2022-01-21 — End: ?
  Filled 2022-01-21: qty 1

## 2022-01-21 MED ORDER — DEXMEDETOMIDINE HCL IN NACL 80 MCG/20ML IV SOLN
INTRAVENOUS | Status: DC | PRN
  Administered 2022-01-21: 8 ug via BUCCAL
  Administered 2022-01-21: 4 ug via BUCCAL

## 2022-01-21 MED ORDER — DEXMEDETOMIDINE HCL IN NACL 80 MCG/20ML IV SOLN
INTRAVENOUS | Status: AC
  Filled 2022-01-21: qty 20

## 2022-01-21 MED ORDER — LIDOCAINE HCL (PF) 2 % IJ SOLN
INTRAMUSCULAR | Status: AC
Start: 2022-01-21 — End: ?
  Filled 2022-01-21: qty 5

## 2022-01-21 MED ORDER — PROPOFOL 10 MG/ML IV BOLUS
INTRAVENOUS | Status: AC
  Filled 2022-01-21: qty 40

## 2022-01-21 MED ORDER — PROPOFOL 500 MG/50ML IV EMUL
INTRAVENOUS | Status: DC | PRN
  Administered 2022-01-21: 200 ug/kg/min via INTRAVENOUS

## 2022-01-21 MED ORDER — SODIUM CHLORIDE 0.9 % IV SOLN
INTRAVENOUS | Status: DC
  Administered 2022-01-21: 20 mL/h via INTRAVENOUS

## 2022-01-21 MED ORDER — LIDOCAINE HCL (CARDIAC) PF 100 MG/5ML IV SOSY
PREFILLED_SYRINGE | INTRAVENOUS | Status: DC | PRN
  Administered 2022-01-21: 30 mg via INTRAVENOUS
  Administered 2022-01-21: 50 mg via INTRAVENOUS

## 2022-01-21 NOTE — Op Note (Signed)
[facility] Gastroenterology Patient Name: Monica Price Procedure Date: 01/21/2022 8:19 AM MRN: [id number] Account #: [id number] Date of Birth: [birth year] Admit Type: Outpatient Age: 32 Room: [facility] ENDO ROOM 3 Gender: Female Note Status: Finalized Instrument Name: Park Meo [id number] Procedure:             Colonoscopy Indications:           Heme positive stool Providers:             Jonathon Bellows MD, MD Medicines:             Monitored Anesthesia Care Complications:         No immediate complications. Procedure:             Pre-Anesthesia Assessment:                        - Prior to the procedure, a History and Physical was                         performed, and patient medications, allergies and                         sensitivities were reviewed. The patient's tolerance                         of previous anesthesia was reviewed.                        - The risks and benefits of the procedure and the                         sedation options and risks were discussed with the                         patient. All questions were answered and informed                         consent was obtained.                        - ASA Grade Assessment: II - A patient with mild                         systemic disease.                        After obtaining informed consent, the colonoscope was                         passed under direct vision. Throughout the procedure,                         the patient's blood pressure, pulse, and oxygen                         saturations were monitored continuously. The                         Colonoscope was introduced through the anus and  advanced to the the cecum, identified by the                         appendiceal orifice. The colonoscopy was performed                         with ease. The patient tolerated the procedure well.                         The quality of the bowel preparation was  good. Findings:      The perianal and digital rectal examinations were normal.      A 5 mm polyp was found in the sigmoid colon. The polyp was sessile. The       polyp was removed with a cold snare. Resection and retrieval were       complete.      Non-bleeding internal hemorrhoids were found during retroflexion. The       hemorrhoids were medium-sized and Grade I (internal hemorrhoids that do       not prolapse).      The exam was otherwise without abnormality on direct and retroflexion       views. Impression:            - One 5 mm polyp in the sigmoid colon, removed with a                         cold snare. Resected and retrieved.                        - Non-bleeding internal hemorrhoids.                        - The examination was otherwise normal on direct and                         retroflexion views. Recommendation:        - Discharge patient to home.                        - Resume previous diet.                        - Continue present medications.                        - Await pathology results.                        - Repeat colonoscopy for surveillance based on                         pathology results. Procedure Code(s):     --- Professional ---                        [phone], Colonoscopy, flexible; with removal of                         tumor(s), polyp(s), or other lesion(s) by snare  technique Diagnosis Code(s):     --- Professional ---                        D12.5, Benign neoplasm of sigmoid colon                        K64.0, First degree hemorrhoids                        R19.5, Other fecal abnormalities CPT copyright 2022 American Medical Association. All rights reserved. The codes documented in this report are preliminary and upon coder review may  be revised to meet current compliance requirements. Jonathon Bellows, MD Jonathon Bellows MD, MD 01/21/2022 8:46:30 AM This report has been signed electronically. Number of Addenda: 0 Note Initiated  On: 01/21/2022 8:19 AM Scope Withdrawal Time: 0 hours 8 minutes 24 seconds  Total Procedure Duration: 0 hours 11 minutes 9 seconds  Estimated Blood Loss:  Estimated blood loss: none.      [facility]

## 2022-01-21 NOTE — Transfer of Care (Signed)
Immediate Anesthesia Transfer of Care Note  Patient: Monica Price  Procedure(s) Performed: COLONOSCOPY WITH PROPOFOL ESOPHAGOGASTRODUODENOSCOPY (EGD)  Patient Location: PACU  Anesthesia Type:General  Level of Consciousness: awake, alert , oriented, and drowsy  Airway & Oxygen Therapy: Patient Spontanous Breathing and Patient connected to face mask oxygen  Post-op Assessment: Report given to RN and Post -op Vital signs reviewed and stable  Post vital signs: Reviewed and stable  Last Vitals:  Vitals Value Taken Time  BP    Temp    Pulse    Resp    SpO2      Last Pain:  Vitals:   01/21/22 0724  TempSrc: Temporal  PainSc: 0-No pain         Complications: No notable events documented.

## 2022-01-21 NOTE — Op Note (Signed)
[facility] Gastroenterology Patient Name: Monica Price Procedure Date: 01/21/2022 8:20 AM MRN: [id number] Account #: [id number] Date of Birth: 02-28-1990 Admit Type: Outpatient Age: 32 Room: [facility] ENDO ROOM 3 Gender: Female Note Status: Finalized Instrument Name: Altamese Cabal Endoscope [id number] Procedure:             Upper GI endoscopy Indications:           Positive celiac serologies Providers:             Jonathon Bellows MD, MD Medicines:             Monitored Anesthesia Care Complications:         No immediate complications. Procedure:             Pre-Anesthesia Assessment:                        - Prior to the procedure, a History and Physical was                         performed, and patient medications, allergies and                         sensitivities were reviewed. The patient's tolerance                         of previous anesthesia was reviewed.                        - The risks and benefits of the procedure and the                         sedation options and risks were discussed with the                         patient. All questions were answered and informed                         consent was obtained.                        - ASA Grade Assessment: II - A patient with mild                         systemic disease.                        After obtaining informed consent, the endoscope was                         passed under direct vision. Throughout the procedure,                         the patient's blood pressure, pulse, and oxygen                         saturations were monitored continuously. The Endoscope                         was introduced through the mouth, and advanced to the  third part of duodenum. The upper GI endoscopy was                         accomplished with ease. The patient tolerated the                         procedure well. Findings:      The stomach was normal.      The esophagus was normal.      The  cardia and gastric fundus were normal on retroflexion.      The examined duodenum was normal. Biopsies for histology were taken with       a cold forceps for evaluation of celiac disease. Impression:            - Normal stomach.                        - Normal esophagus.                        - Normal examined duodenum. Biopsied. Recommendation:        - Await pathology results.                        - Perform a colonoscopy today. Procedure Code(s):     --- Professional ---                        [phone], Esophagogastroduodenoscopy, flexible,                         transoral; with biopsy, single or multiple Diagnosis Code(s):     --- Professional ---                        R76.8, Other specified abnormal immunological findings                         in serum CPT copyright 2022 American Medical Association. All rights reserved. The codes documented in this report are preliminary and upon coder review may  be revised to meet current compliance requirements. Jonathon Bellows, MD Jonathon Bellows MD, MD 01/21/2022 8:31:53 AM This report has been signed electronically. Number of Addenda: 0 Note Initiated On: 01/21/2022 8:20 AM Estimated Blood Loss:  Estimated blood loss: none.      [facility]

## 2022-01-21 NOTE — Anesthesia Preprocedure Evaluation (Addendum)
Anesthesia Evaluation  Patient identified by MRN, date of birth, ID band Patient awake    Reviewed: Allergy & Precautions, NPO status , Patient's Chart, lab work & pertinent test results  History of Anesthesia Complications Negative for: history of anesthetic complications  Airway Mallampati: I  TM Distance: >3 FB Neck ROM: full    Dental no notable dental hx.    Pulmonary neg pulmonary ROS   Pulmonary exam normal        Cardiovascular negative cardio ROS Normal cardiovascular exam     Neuro/Psych negative neurological ROS  negative psych ROS   GI/Hepatic Neg liver ROS,GERD  ,,  Endo/Other  negative endocrine ROS    Renal/GU negative Renal ROS  negative genitourinary   Musculoskeletal   Abdominal   Peds  Hematology negative hematology ROS (+)   Anesthesia Other Findings Past Medical History: No date: Anemia No date: GERD (gastroesophageal reflux disease) No date: Interstitial cystitis No date: Microscopic hematuria No date: Recurrent UTI     Comment:  Pt has been treated for kidney infection and UTI for 10               months  Past Surgical History: 02/08/2018: CYSTO WITH HYDRODISTENSION; N/A     Comment:  Procedure: CYSTOSCOPY/HYDRODISTENSION;  Surgeon: Royston Cowper, MD;  Location: [facility];  Service: Urology;                Laterality: N/A; No date: WISDOM TOOTH EXTRACTION  BMI    Body Mass Index: 26.79 kg/m      Reproductive/Obstetrics negative OB ROS                             Anesthesia Physical Anesthesia Plan  ASA: 2  Anesthesia Plan: General   Post-op Pain Management: Minimal or no pain anticipated   Induction: Intravenous  PONV Risk Score and Plan: Propofol infusion and TIVA  Airway Management Planned: Natural Airway and Nasal Cannula  Additional Equipment:   Intra-op Plan:   Post-operative Plan:   Informed Consent: I have  reviewed the patients History and Physical, chart, labs and discussed the procedure including the risks, benefits and alternatives for the proposed anesthesia with the patient or authorized representative who has indicated his/her understanding and acceptance.     Dental Advisory Given  Plan Discussed with: Anesthesiologist, CRNA and Surgeon  Anesthesia Plan Comments: (Patient consented for risks of anesthesia including but not limited to:  - adverse reactions to medications - risk of airway placement if required - damage to eyes, teeth, lips or other oral mucosa - nerve damage due to positioning  - sore throat or hoarseness - Damage to heart, brain, nerves, lungs, other parts of body or loss of life  Patient voiced understanding.  Patient unable to produce urine sample for UPT. Discussed risks of anesthesia in early pregnancy including but not limited to teratogenesis and fetal demise. Patient voiced understanding and wishes to proceed. )        Anesthesia Quick Evaluation

## 2022-01-21 NOTE — Anesthesia Postprocedure Evaluation (Signed)
Anesthesia Post Note  Patient: Janari Gagner  Procedure(s) Performed: COLONOSCOPY WITH PROPOFOL ESOPHAGOGASTRODUODENOSCOPY (EGD)  Patient location during evaluation: Endoscopy Anesthesia Type: General Level of consciousness: awake and alert Pain management: pain level controlled Vital Signs Assessment: post-procedure vital signs reviewed and stable Respiratory status: spontaneous breathing, nonlabored ventilation, respiratory function stable and patient connected to nasal cannula oxygen Cardiovascular status: blood pressure returned to baseline and stable Postop Assessment: no apparent nausea or vomiting Anesthetic complications: no  No notable events documented.   Last Vitals:  Vitals:   01/21/22 0857 01/21/22 0907  BP: 97/68 100/72  Pulse: 77 83  Resp: 15 12  Temp:    SpO2: 98% 98%    Last Pain:  Vitals:   01/21/22 0907  TempSrc:   PainSc: 0-No pain                 Ilene Qua

## 2022-01-21 NOTE — H&P (Signed)
Jonathon Bellows, MD [address] [place], [place], [place], [address] [address], [place], [place], [place], [address] Phone: [phone]  Fax: [phone]  Primary Care Physician:  Pleas Koch, NP   Pre-Procedure History & Physical: HPI:  Monica Price is a 32 y.o. female is here for an endoscopy and colonoscopy    Past Medical History:  Diagnosis Date   Anemia    GERD (gastroesophageal reflux disease)    Interstitial cystitis    Microscopic hematuria    Recurrent UTI    Pt has been treated for kidney infection and UTI for 10 months    Past Surgical History:  Procedure Laterality Date   CYSTO WITH HYDRODISTENSION N/A 02/08/2018   Procedure: CYSTOSCOPY/HYDRODISTENSION;  Surgeon: Royston Cowper, MD;  Location: [facility];  Service: Urology;  Laterality: N/A;   WISDOM TOOTH EXTRACTION      Prior to Admission medications   Medication Sig Start Date End Date Taking? Authorizing Provider  levonorgestrel (MIRENA) 20 MCG/DAY IUD 1 each by Intrauterine route once.   Yes [provider]  amoxicillin-clavulanate (AUGMENTIN) 875-125 MG tablet Take 1 tablet by mouth 2 (two) times daily. Patient not taking: Reported on 01/21/2022 12/30/21   Brunetta Jeans, PA-C    Allergies as of 12/16/2021   (No Known Allergies)    Family History  Problem Relation Age of Onset   Healthy Mother    Healthy Father    Bladder Cancer Maternal Aunt        great   Bladder Cancer Maternal Uncle        great   Kidney Stones Maternal Grandmother    Throat cancer Maternal Grandfather    Kidney disease Neg Hx    Prostate cancer Neg Hx    Kidney cancer Neg Hx    Ovarian cancer Neg Hx    Breast cancer Neg Hx     Social History   Socioeconomic History   Marital status: Married    Spouse name: Not on file   Number of children: Not on file   Years of education: Not on file   Highest education level: Not on file  Occupational History   Not on file  Tobacco Use    Smoking status: Never   Smokeless tobacco: Never  Vaping Use   Vaping Use: Never used  Substance and Sexual Activity   Alcohol use: Not Currently    Comment: RARE   Drug use: Never   Sexual activity: Yes    Birth control/protection: None  Other Topics Concern   Not on file  Social History Narrative   ** Merged History Encounter **       Social Determinants of Health   Financial Resource Strain: Not on file  Food Insecurity: Not on file  Transportation Needs: Not on file  Physical Activity: Not on file  Stress: Not on file  Social Connections: Not on file  Intimate Partner Violence: Not on file    Review of Systems: See HPI, otherwise negative ROS  Physical Exam: BP 117/64   Pulse 84   Temp (!) 96.6 F (35.9 C) (Temporal)   Resp 20   Ht '5\' 9"'$  (1.753 m)   Wt 82.3 kg   SpO2 97%   BMI 26.79 kg/m  General:   Alert,  pleasant and cooperative in NAD Head:  Normocephalic and atraumatic. Neck:  Supple; no masses or thyromegaly. Lungs:  Clear throughout to auscultation, normal respiratory effort.    Heart:  +  S1, +S2, Regular rate and rhythm, No edema. Abdomen:  Soft, nontender and nondistended. Normal bowel sounds, without guarding, and without rebound.   Neurologic:  Alert and  oriented x4;  grossly normal neurologically.  Impression/Plan: Monica Price is here for an endoscopy and colonoscopy  to be performed for  evaluation of positive celiac serology and stool test positive for blood    Risks, benefits, limitations, and alternatives regarding endoscopy have been reviewed with the patient.  Questions have been answered.  All parties agreeable.   Jonathon Bellows, MD  01/21/2022, 8:18 AM

## 2022-01-22 ENCOUNTER — Encounter: Payer: Self-pay | Admitting: Gastroenterology

## 2022-01-22 LAB — SURGICAL PATHOLOGY

## 2022-08-19 ENCOUNTER — Ambulatory Visit (INDEPENDENT_AMBULATORY_CARE_PROVIDER_SITE_OTHER): Payer: 59

## 2022-08-19 VITALS — BP 111/79 | HR 76 | Ht 68.0 in | Wt 178.0 lb

## 2022-08-19 DIAGNOSIS — R309 Painful micturition, unspecified: Secondary | ICD-10-CM

## 2022-08-19 LAB — POCT URINALYSIS DIPSTICK
Bilirubin, UA: NEGATIVE
Glucose, UA: NEGATIVE
Ketones, UA: NEGATIVE
Nitrite, UA: POSITIVE
Protein, UA: POSITIVE — AB
Spec Grav, UA: 1.02 (ref 1.010–1.025)
Urobilinogen, UA: 0.2 E.U./dL
pH, UA: 5 (ref 5.0–8.0)

## 2022-08-19 MED ORDER — SULFAMETHOXAZOLE-TRIMETHOPRIM 800-160 MG PO TABS: 1.0000 | ORAL_TABLET | Freq: Two times a day (BID) | ORAL | 1 refills | Status: DC

## 2022-08-19 NOTE — Progress Notes (Addendum)
    NURSE VISIT NOTE  Subjective:    Patient ID: Tulip Donlan, female    DOB: 03-09-1990, 32 y.o.   MRN: [id number]       HPI  Patient is a 32 y.o. G61P1001 female who presents for urinary frequency, urinary urgency, and cloudy malordorous urine for 1 day.  Patient does not have a history of pyelonephritis.    Objective:    BP 111/79   Pulse 76   Ht 5\' 8"  (1.727 m)   Wt 178 lb (80.7 kg)   Breastfeeding No   BMI 27.06 kg/m    Lab Review  Results for orders placed or performed in visit on 08/19/22  POCT Urinalysis Dipstick  Result Value Ref Range   Color, UA     Clarity, UA     Glucose, UA Negative Negative   Bilirubin, UA Negative    Ketones, UA Negative    Spec Grav, UA 1.020 1.010 - 1.025   Blood, UA 3+    pH, UA 5.0 5.0 - 8.0   Protein, UA Positive (A) Negative   Urobilinogen, UA 0.2 0.2 or 1.0 E.U./dL   Nitrite, UA Positive    Leukocytes, UA Large (3+) (A) Negative   Appearance cloudy    Odor      Assessment:   1. Pain passing urine      Plan:   Urine Culture Sent. Medication sent to pharmacy per protocol.    Loney Laurence, CMA

## 2022-08-19 NOTE — Progress Notes (Signed)
Does not seem like your note is complete. Patient does have evidence of a UTI, can be treated with Macrobid or Bactrim.

## 2022-08-20 NOTE — Progress Notes (Signed)
Note refreshed, medication was sent on 08/20/22

## 2022-12-27 IMAGING — RF DG HYSTEROGRAM
5 series · 14 of 14 positions shown · IV contrast (omnipaque)
Comparison: None.

CLINICAL DATA: Evaluate infertility

EXAM:
HYSTEROSALPINGOGRAM
TECHNIQUE: Following cleansing of the cervix and vagina with Betadine solution,
a hysterosalpingogram was performed using a 5-French
hysterosalpingogram catheter and Omnipaque 300 contrast. The patient
tolerated the examination without difficulty.

[Series 1: cp_standard · 0.26mm/px · 4 of 200 frames shown (1 of 5)]
[frame 31/200]
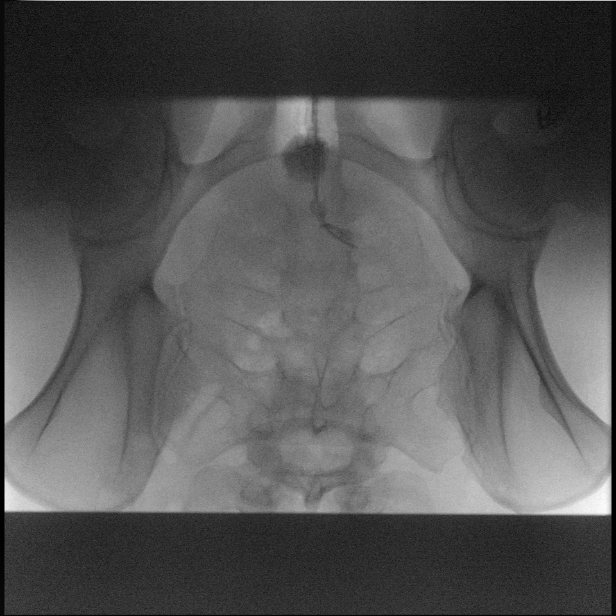
[frame 101/200]
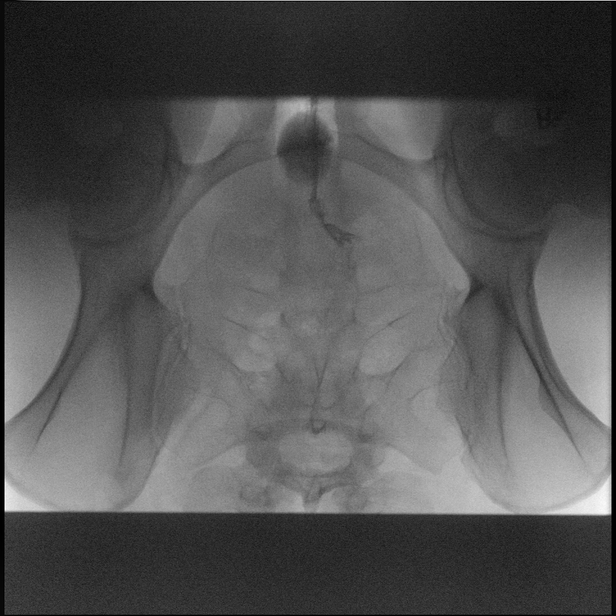
[frame 171/200]
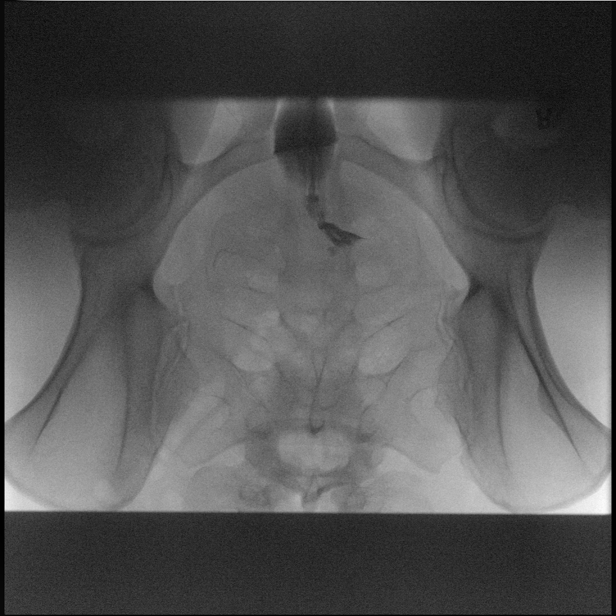
[frame 184/200]
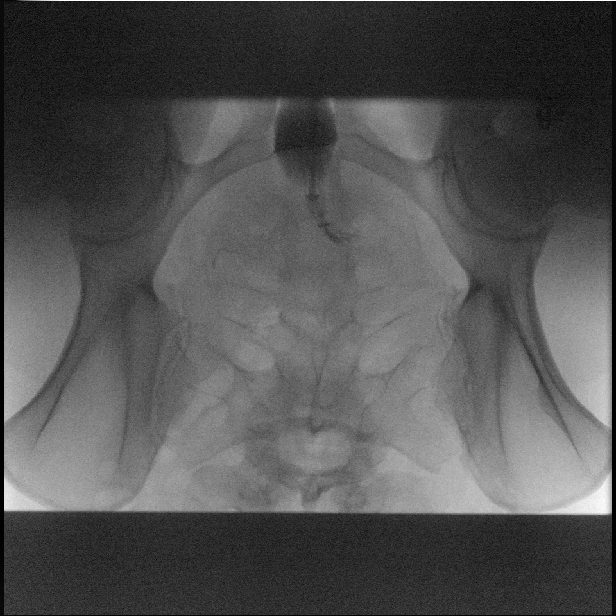

[Series 2: cp_standard · 0.26mm/px · 4 of 153 frames shown (2 of 5)]
[frame 23/153]
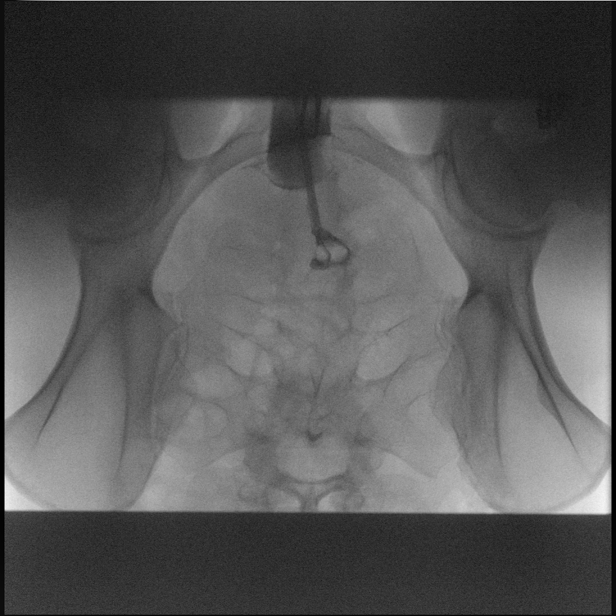
[frame 77/153]
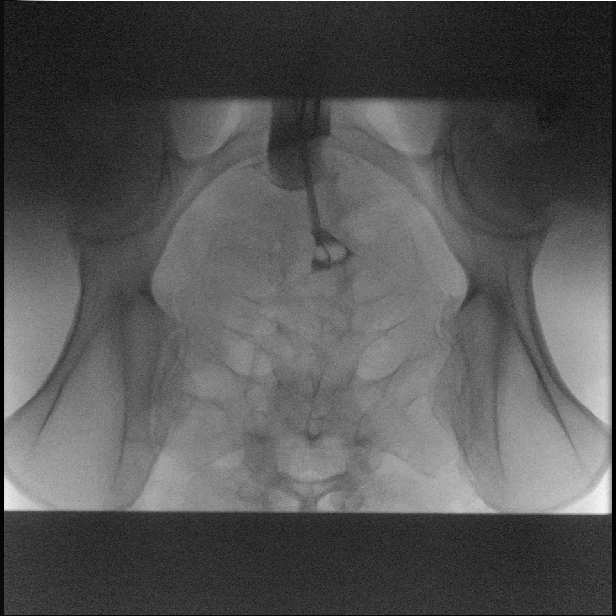
[frame 131/153]
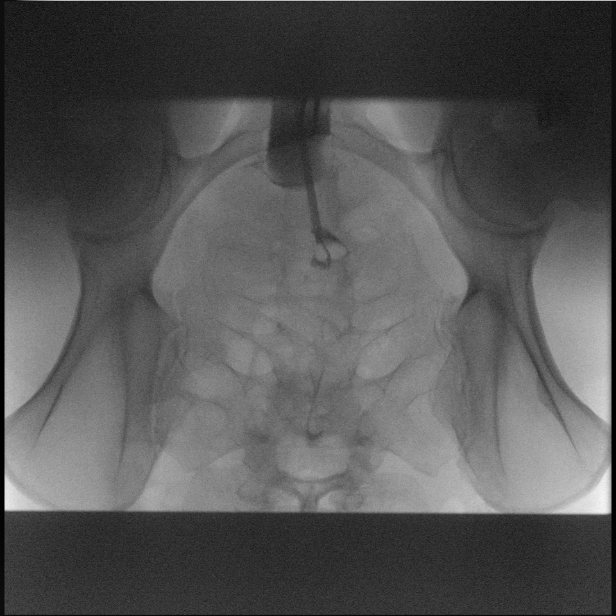
[frame 145/153]
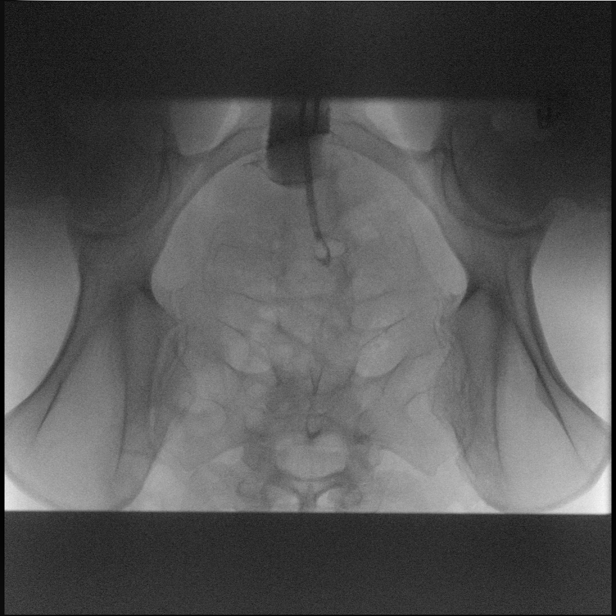

[Series 3: cp_standard · 0.17mm/px · 1 of 1 slices shown (3 of 5)]
[im 1/1]
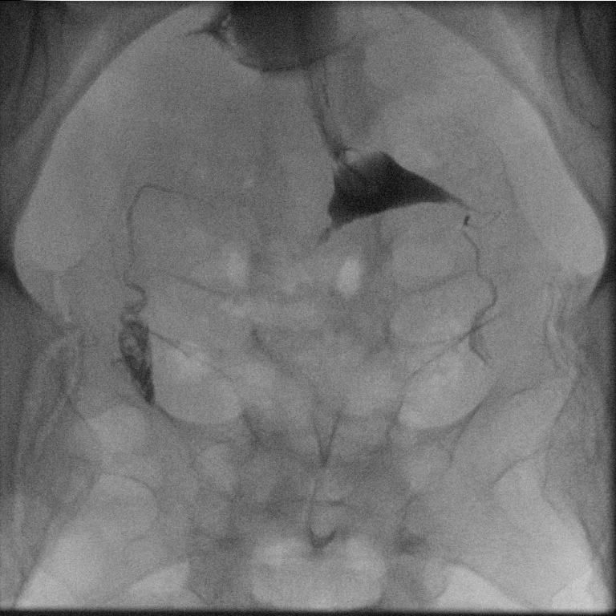

[Series 4: cp_standard · 0.17mm/px · 4 of 295 frames shown (4 of 5)]
[frame 6/295]
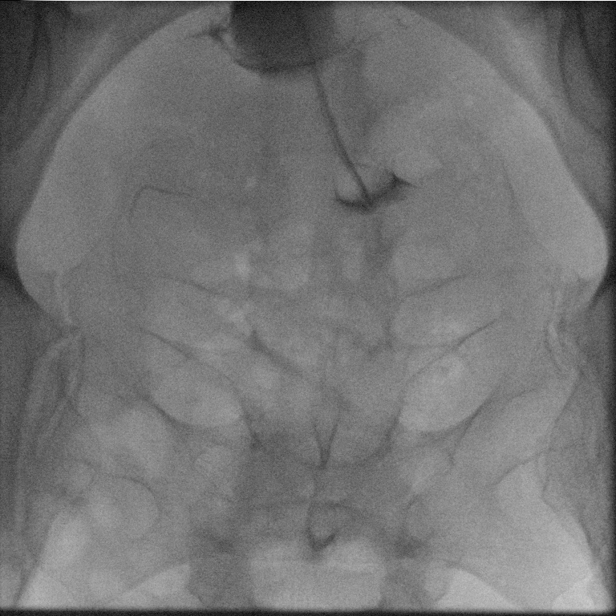
[frame 45/295]
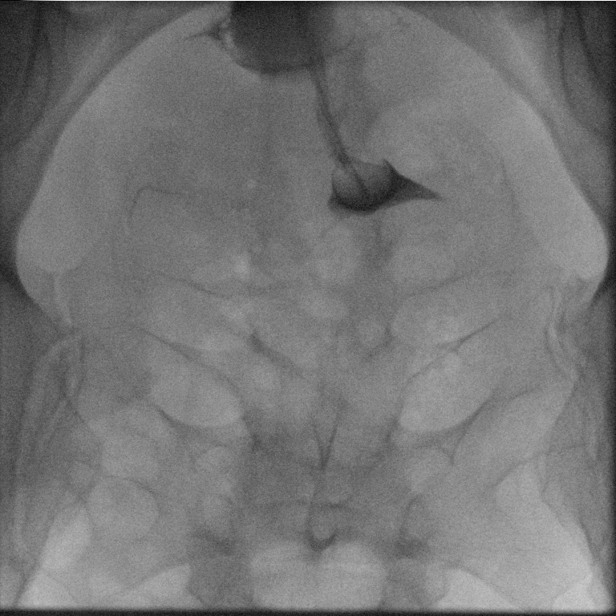
[frame 148/295]
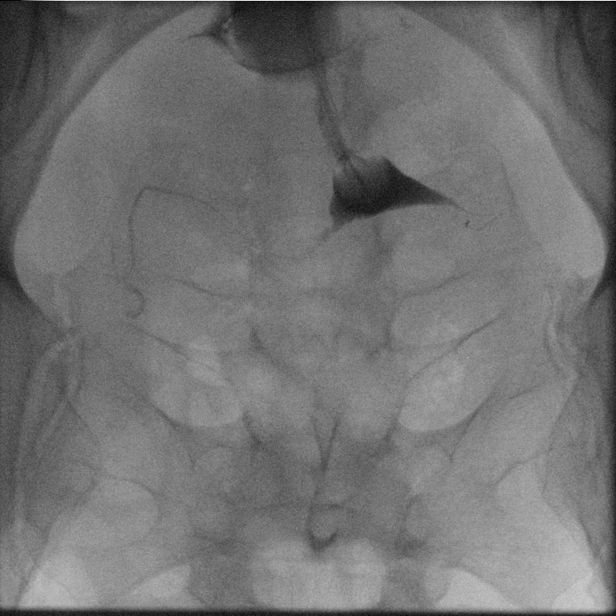
[frame 251/295]
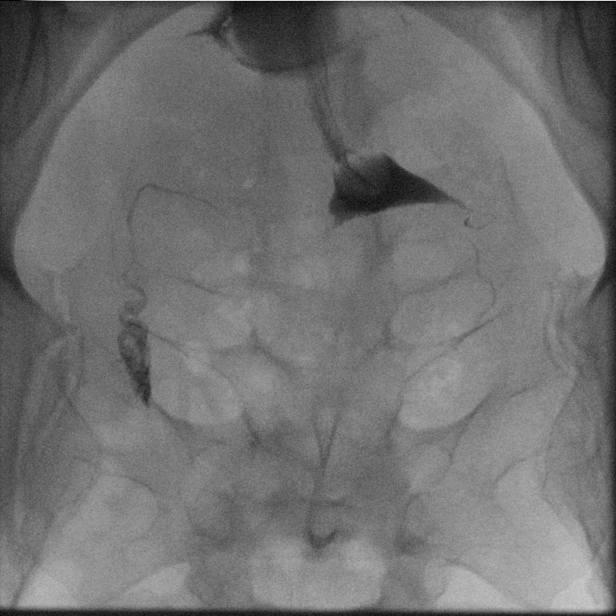

[Series 5: cp_standard · 0.17mm/px · 1 of 1 slices shown (5 of 5)]
[im 1/1]
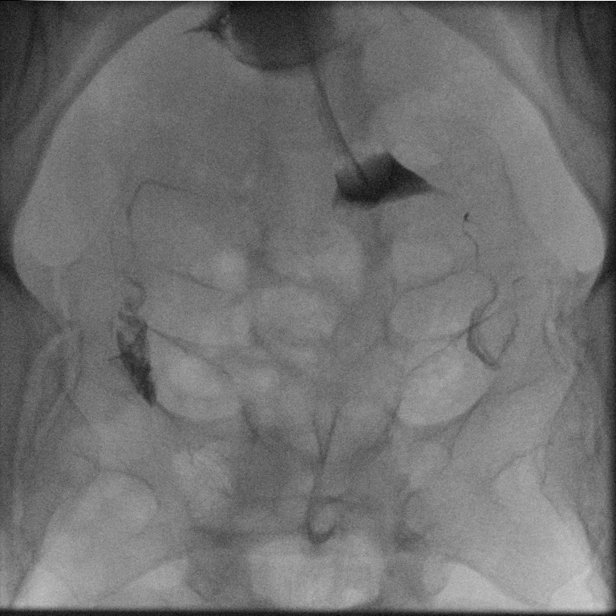

[14 of 14 positions shown; findings below may reference images not displayed]

FLUOROSCOPY TIME:  Radiation Exposure Index (as provided by the
fluoroscopic device): 16.1 mGy

If the device does not provide the exposure index:

Fluoroscopy Time:  1.5 minute
FINDINGS: Dr. Tiger inserted a catheter into the cervix. 40 mL Isovue 370
contrast was then instilled into the uterus using intermittent
fluoroscopy.

The uterus demonstrates normal contour, orientation, and morphology
with no filling defects. The fallopian tubes fill bilaterally
demonstrating no filling defects or stenoses. There was free
spillage of contrast from the fallopian tubes into the
intraperitoneal cavity.
IMPRESSION: 1. Normal hysterosalpingogram. Both Fallopian tubes fill bilaterally
demonstrating no filling defects or stenoses.

## 2023-03-01 DIAGNOSIS — D225 Melanocytic nevi of trunk: Secondary | ICD-10-CM | POA: Diagnosis not present

## 2023-03-01 DIAGNOSIS — L308 Other specified dermatitis: Secondary | ICD-10-CM | POA: Diagnosis not present

## 2023-03-01 DIAGNOSIS — D2271 Melanocytic nevi of right lower limb, including hip: Secondary | ICD-10-CM | POA: Diagnosis not present

## 2023-03-01 DIAGNOSIS — D2272 Melanocytic nevi of left lower limb, including hip: Secondary | ICD-10-CM | POA: Diagnosis not present

## 2023-03-01 DIAGNOSIS — D2261 Melanocytic nevi of right upper limb, including shoulder: Secondary | ICD-10-CM | POA: Diagnosis not present

## 2023-03-01 DIAGNOSIS — D2262 Melanocytic nevi of left upper limb, including shoulder: Secondary | ICD-10-CM | POA: Diagnosis not present

## 2023-04-28 ENCOUNTER — Encounter: Payer: Self-pay | Admitting: Obstetrics and Gynecology

## 2023-04-28 ENCOUNTER — Ambulatory Visit (INDEPENDENT_AMBULATORY_CARE_PROVIDER_SITE_OTHER): Admitting: Obstetrics and Gynecology

## 2023-04-28 VITALS — BP 107/80 | HR 68 | Ht 68.0 in | Wt 179.3 lb

## 2023-04-28 DIAGNOSIS — N941 Unspecified dyspareunia: Secondary | ICD-10-CM

## 2023-04-28 DIAGNOSIS — Z30431 Encounter for routine checking of intrauterine contraceptive device: Secondary | ICD-10-CM

## 2023-04-28 DIAGNOSIS — Z01419 Encounter for gynecological examination (general) (routine) without abnormal findings: Secondary | ICD-10-CM

## 2023-04-28 DIAGNOSIS — Z13220 Encounter for screening for lipoid disorders: Secondary | ICD-10-CM | POA: Diagnosis not present

## 2023-04-28 DIAGNOSIS — Z131 Encounter for screening for diabetes mellitus: Secondary | ICD-10-CM | POA: Diagnosis not present

## 2023-04-28 NOTE — Patient Instructions (Signed)
 Preventive Care 32-32 Years Old, Female Preventive care refers to lifestyle choices and visits with your health care provider that can promote health and wellness. Preventive care visits are also called wellness exams. What can I expect for my preventive care visit? Counseling During your preventive care visit, your health care provider may ask about your: Medical history, including: Past medical problems. Family medical history. Pregnancy history. Current health, including: Menstrual cycle. Method of birth control. Emotional well-being. Home life and relationship well-being. Sexual activity and sexual health. Lifestyle, including: Alcohol, nicotine or tobacco, and drug use. Access to firearms. Diet, exercise, and sleep habits. Work and work Astronomer. Sunscreen use. Safety issues such as seatbelt and bike helmet use. Physical exam Your health care provider may check your: Height and weight. These may be used to calculate your BMI (body mass index). BMI is a measurement that tells if you are at a healthy weight. Waist circumference. This measures the distance around your waistline. This measurement also tells if you are at a healthy weight and may help predict your risk of certain diseases, such as type 2 diabetes and high blood pressure. Heart rate and blood pressure. Body temperature. Skin for abnormal spots. What immunizations do I need?  Vaccines are usually given at various ages, according to a schedule. Your health care provider will recommend vaccines for you based on your age, medical history, and lifestyle or other factors, such as travel or where you work. What tests do I need? Screening Your health care provider may recommend screening tests for certain conditions. This may include: Pelvic exam and Pap test. Lipid and cholesterol levels. Diabetes screening. This is done by checking your blood sugar (glucose) after you have not eaten for a while (fasting). Hepatitis  B test. Hepatitis C test. HIV (human immunodeficiency virus) test. STI (sexually transmitted infection) testing, if you are at risk. BRCA-related cancer screening. This may be done if you have a family history of breast, ovarian, tubal, or peritoneal cancers. Talk with your health care provider about your test results, treatment options, and if necessary, the need for more tests. Follow these instructions at home: Eating and drinking  Eat a healthy diet that includes fresh fruits and vegetables, whole grains, lean protein, and low-fat dairy products. Take vitamin and mineral supplements as recommended by your health care provider. Do not drink alcohol if: Your health care provider tells you not to drink. You are pregnant, may be pregnant, or are planning to become pregnant. If you drink alcohol: Limit how much you have to 0-1 drink a day. Know how much alcohol is in your drink. In the [place] one drink equals one 12 oz bottle of beer (355 mL), one 5 oz glass of wine (148 mL), or one 1 oz glass of hard liquor (44 mL). Lifestyle Brush your teeth every morning and night with fluoride toothpaste. Floss one time each day. Exercise for at least 30 minutes 5 or more days each week. Do not use any products that contain nicotine or tobacco. These products include cigarettes, chewing tobacco, and vaping devices, such as e-cigarettes. If you need help quitting, ask your health care provider. Do not use drugs. If you are sexually active, practice safe sex. Use a condom or other form of protection to prevent STIs. If you do not wish to become pregnant, use a form of birth control. If you plan to become pregnant, see your health care provider for a prepregnancy visit. Find healthy ways to manage stress, such as: Meditation,  yoga, or listening to music. Journaling. Talking to a trusted person. Spending time with friends and family. Minimize exposure to UV radiation to reduce your risk of skin  cancer. Safety Always wear your seat belt while driving or riding in a vehicle. Do not drive: If you have been drinking alcohol. Do not ride with someone who has been drinking. If you have been using any mind-altering substances or drugs. While texting. When you are tired or distracted. Wear a helmet and other protective equipment during sports activities. If you have firearms in your house, make sure you follow all gun safety procedures. Seek help if you have been physically or sexually abused. What's next? Go to your health care provider once a year for an annual wellness visit. Ask your health care provider how often you should have your eyes and teeth checked. Stay up to date on all vaccines. This information is not intended to replace advice given to you by your health care provider. Make sure you discuss any questions you have with your health care provider. Document Revised: 06/26/2020 Document Reviewed: 06/26/2020 Elsevier Patient Education  2024 [employer] Breast Self-Awareness Breast self-awareness is knowing how your breasts look and feel. You need to: Check your breasts on a regular basis. Tell your doctor about any changes. Become familiar with the look and feel of your breasts. This can help you catch a breast problem while it is still small and can be treated. You should do breast self-exams even if you have breast implants. What you need: A mirror. A well-lit room. A pillow or other soft object. How to do a breast self-exam Follow these steps to do a breast self-exam: Look for changes  Take off all the clothes above your waist. Stand in front of a mirror in a room with good lighting. Put your hands down at your sides. Compare your breasts in the mirror. Look for any difference between them, such as: A difference in shape. A difference in size. Wrinkles, dips, and bumps in one breast and not the other. Look at each breast for changes in the skin, such  as: Redness. Scaly areas. Skin that has gotten thicker. Dimpling. Open sores (ulcers). Look for changes in your nipples, such as: Fluid coming out of a nipple. Fluid around a nipple. Bleeding. Dimpling. Redness. A nipple that looks pushed in (retracted), or that has changed position. Feel for changes Lie on your back. Feel each breast. To do this: Pick a breast to feel. Place a pillow under the shoulder closest to that breast. Put the arm closest to that breast behind your head. Feel the nipple area of that breast using the hand of your other arm. Feel the area with the pads of your three middle fingers by making small circles with your fingers. Use light, medium, and firm pressure. Continue the overlapping circles, moving downward over the breast. Keep making circles with your fingers. Stop when you feel your ribs. Start making circles with your fingers again, this time going upward until you reach your collarbone. Then, make circles outward across your breast and into your armpit area. Squeeze your nipple. Check for discharge and lumps. Repeat these steps to check your other breast. Sit or stand in the tub or shower. With soapy water on your skin, feel each breast the same way you did when you were lying down. Write down what you find Writing down what you find can help you remember what to tell your doctor. Write down: What is  normal for each breast. Any changes you find in each breast. These include: The kind of changes you find. A tender or painful breast. Any lump you find. Write down its size and where it is. When you last had your monthly period (menstrual cycle). General tips If you are breastfeeding, the best time to check your breasts is after you feed your baby or after you use a breast pump. If you get monthly bleeding, the best time to check your breasts is 5-7 days after your monthly cycle ends. With time, you will become comfortable with the self-exam. You will  also start to know if there are changes in your breasts. Contact a doctor if: You see a change in the shape or size of your breasts or nipples. You see a change in the skin of your breast or nipples, such as red or scaly skin. You have fluid coming from your nipples that is not normal. You find a new lump or thick area. You have breast pain. You have any concerns about your breast health. Summary Breast self-awareness includes looking for changes in your breasts and feeling for changes within your breasts. You should do breast self-awareness in front of a mirror in a well-lit room. If you get monthly periods (menstrual cycles), the best time to check your breasts is 5-7 days after your period ends. Tell your doctor about any changes you see in your breasts. Changes include changes in size, changes on the skin, painful or tender breasts, or fluid from your nipples that is not normal. This information is not intended to replace advice given to you by your health care provider. Make sure you discuss any questions you have with your health care provider. Document Revised: 06/05/2021 Document Reviewed: 10/31/2020 Elsevier Patient Education  2024 [employer].

## 2023-04-28 NOTE — Progress Notes (Signed)
 GYNECOLOGY ANNUAL PHYSICAL EXAM PROGRESS NOTE  Subjective:    Monica Price is a 33 y.o. G30P1001 female who presents for an annual exam.  The patient is sexually active. The patient participates in regular exercise: yes. Has the patient ever been transfused or tattooed?: no. The patient reports that there is not domestic violence in her life.   The patient has the following complaints today: None.  Notes that she will be planning on having her IUD removed after her husband gets his vasectomy performed later this year. Has been having discomfort with sex since IUD was inserted 2 years ago.   Menstrual History: Menarche age: 59 No LMP recorded. (Menstrual status: IUD).    Gynecologic History:  Contraception: IUD History of STI's: Denies Last Pap: 10/29/2020. Results were: normal. Denies h/o abnormal pap smears. Last mammogram: Not age appropriate    Upstream - 04/28/23 1314       Pregnancy Intention Screening   Does the patient want to become pregnant in the next year? No    Does the patient's partner want to become pregnant in the next year? No    Would the patient like to discuss contraceptive options today? Yes      Contraception Wrap Up   Current Method IUD or IUS    End Method IUD or IUS    Contraception Counseling Provided No    How was the end contraceptive method provided? N/A            The pregnancy intention screening data noted above was reviewed. Potential methods of contraception were discussed. The patient elected to proceed with IUD or IUS.   OB History  Gravida Para Term Preterm AB Living  1 1 1  0 0 1  SAB IAB Ectopic Multiple Live Births  0 0 0 0 1    # Outcome Date GA Lbr Len/2nd Weight Sex Type Anes PTL Lv  1 Term 05/02/21 [redacted]w[redacted]d / 00:55 7 lb 3.3 oz (3.27 kg) F Vag-Spont EPI  LIV     Name: Welty,GIRL Lasha     Apgar1: 6  Apgar5: 9    Past Medical History:  Diagnosis Date   Anemia    GERD (gastroesophageal reflux disease)     Interstitial cystitis    Microscopic hematuria    Recurrent UTI    Pt has been treated for kidney infection and UTI for 10 months    Past Surgical History:  Procedure Laterality Date   COLONOSCOPY WITH PROPOFOL N/A 01/21/2022   Procedure: COLONOSCOPY WITH PROPOFOL;  Surgeon: Wyline Mood, MD;  Location: [facility] ENDOSCOPY;  Service: Gastroenterology;  Laterality: N/A;   CYSTO WITH HYDRODISTENSION N/A 02/08/2018   Procedure: CYSTOSCOPY/HYDRODISTENSION;  Surgeon: Orson Ape, MD;  Location: [facility] ORS;  Service: Urology;  Laterality: N/A;   ESOPHAGOGASTRODUODENOSCOPY N/A 01/21/2022   Procedure: ESOPHAGOGASTRODUODENOSCOPY (EGD);  Surgeon: Wyline Mood, MD;  Location: [facility] ENDOSCOPY;  Service: Gastroenterology;  Laterality: N/A;   WISDOM TOOTH EXTRACTION      Family History  Problem Relation Age of Onset   Healthy Mother    Healthy Father    Bladder Cancer Maternal Aunt        great   Bladder Cancer Maternal Uncle        great   Kidney Stones Maternal Grandmother    Throat cancer Maternal Grandfather    Kidney disease Neg Hx    Prostate cancer Neg Hx    Kidney cancer Neg Hx    Ovarian cancer Neg Hx  Breast cancer Neg Hx     Social History   Socioeconomic History   Marital status: Married    Spouse name: Not on file   Number of children: Not on file   Years of education: Not on file   Highest education level: Not on file  Occupational History   Not on file  Tobacco Use   Smoking status: Never   Smokeless tobacco: Never  Vaping Use   Vaping status: Never Used  Substance and Sexual Activity   Alcohol use: Not Currently    Comment: RARE   Drug use: Never   Sexual activity: Yes    Birth control/protection: None  Other Topics Concern   Not on file  Social History Narrative   ** Merged History Encounter **       Social Drivers of Corporate investment banker Strain: Not on file  Food Insecurity: Not on file  Transportation Needs: Not on file  Physical Activity: Not  on file  Stress: Not on file  Social Connections: Not on file  Intimate Partner Violence: Not on file    Current Outpatient Medications on File Prior to Visit  Medication Sig Dispense Refill   amoxicillin-clavulanate (AUGMENTIN) 875-125 MG tablet Take 1 tablet by mouth 2 (two) times daily. (Patient not taking: Reported on 01/21/2022) 14 tablet 0   levonorgestrel (MIRENA) 20 MCG/DAY IUD 1 each by Intrauterine route once.     sulfamethoxazole-trimethoprim (BACTRIM DS) 800-160 MG tablet Take 1 tablet by mouth 2 (two) times daily. 14 tablet 1   No current facility-administered medications on file prior to visit.    No Known Allergies   Review of Systems Constitutional: negative for chills, fatigue, fevers and sweats Eyes: negative for irritation, redness and visual disturbance Ears, nose, mouth, throat, and face: negative for hearing loss, nasal congestion, snoring and tinnitus Respiratory: negative for asthma, cough, sputum Cardiovascular: negative for chest pain, dyspnea, exertional chest pressure/discomfort, irregular heart beat, palpitations and syncope Gastrointestinal: negative for abdominal pain, change in bowel habits, nausea and vomiting Genitourinary: negative for abnormal menstrual periods, genital lesions, sexual problems and vaginal discharge, dysuria and urinary incontinence Integument/breast: negative for breast lump, breast tenderness and nipple discharge Hematologic/lymphatic: negative for bleeding and easy bruising Musculoskeletal:negative for back pain and muscle weakness Neurological: negative for dizziness, headaches, vertigo and weakness Endocrine: negative for diabetic symptoms including polydipsia, polyuria and skin dryness Allergic/Immunologic: negative for hay fever and urticaria      Objective:  Blood pressure 107/80, pulse 68, height 5\' 8"  (1.727 m), weight 179 lb 4.8 oz (81.3 kg), not currently breastfeeding.  Body mass index is 27.26 kg/m.    General  Appearance:    Alert, cooperative, no distress, appears stated age,   Head:    Normocephalic, without obvious abnormality, atraumatic  Eyes:    PERRL, conjunctiva/corneas clear, EOM's intact, both eyes  Ears:    Normal external ear canals, both ears  Nose:   Nares normal, septum midline, mucosa normal, no drainage or sinus tenderness  Throat:   Lips, mucosa, and tongue normal; teeth and gums normal  Neck:   Supple, symmetrical, trachea midline, no adenopathy; thyroid: no enlargement/tenderness/nodules; no carotid bruit or JVD  Back:     Symmetric, no curvature, ROM normal, no CVA tenderness  Lungs:     Clear to auscultation bilaterally, respirations unlabored  Chest Wall:    No tenderness or deformity   Heart:    Regular rate and rhythm, S1 and S2 normal, no murmur, rub  or gallop  Breast Exam:    No tenderness, masses, or nipple abnormality  Abdomen:     Soft, non-tender, bowel sounds active all four quadrants, no masses, no organomegaly.    Genitalia:    Pelvic:external genitalia normal, vagina without lesions, discharge, or tenderness, rectovaginal septum  normal. Cervix normal in appearance, no cervical motion tenderness, IUD threads 3-4 cm in length from external os. No adnexal masses or tenderness.  Uterus normal size, shape, mobile, regular contours, nontender.  Rectal:    Normal external sphincter.  No hemorrhoids appreciated. Internal exam not done.   Extremities:   Extremities normal, atraumatic, no cyanosis or edema  Pulses:   2+ and symmetric all extremities  Skin:   Skin color, texture, turgor normal, no rashes or lesions  Lymph nodes:   Cervical, supraclavicular, and axillary nodes normal  Neurologic:   CNII-XII intact, normal strength, sensation and reflexes throughout   .  Labs:  Lab Results  Component Value Date   WBC 6.7 10/21/2021   HGB 15.0 10/21/2021   HCT 43.5 10/21/2021   MCV 92.0 10/21/2021   PLT 204.0 10/21/2021    Lab Results  Component Value Date    CREATININE 0.80 10/25/2019   BUN 10 10/25/2019   NA 138 10/25/2019   K 4.4 10/25/2019   CL 102 10/25/2019   CO2 25 10/25/2019    Lab Results  Component Value Date   ALT 7 10/25/2019   AST 14 10/25/2019   ALKPHOS 53 10/25/2019   BILITOT 0.3 10/25/2019    Lab Results  Component Value Date   TSH 0.798 10/25/2019     Assessment:   1. Encounter for well woman exam with routine gynecological exam   2. Dyspareunia in female   3. Encounter for routine checking of intrauterine contraceptive device (IUD)      Plan:  - Blood tests: pending. - Breast self exam technique reviewed and patient encouraged to perform self-exam monthly. - Contraception: IUD.  Plans for removal after husband's vasectomy. Offered to remove today in light of associated dyspareunia and initiate an alternative method, however patient declines.  - Discussed healthy lifestyle modifications. - Mammogram  : Not age appropriate - Pap smear  UTD .  Due in October. Can likely perform at IUD removal visit when scheduled.   Follow up in 1 year for annual exam   Teresa Fender, MD [facility]

## 2023-04-29 ENCOUNTER — Encounter: Payer: Self-pay | Admitting: Obstetrics and Gynecology

## 2023-04-29 LAB — CBC
Hematocrit: 43.4 % (ref 34.0–46.6)
Hemoglobin: 14.7 g/dL (ref 11.1–15.9)
MCH: 31.3 pg (ref 26.6–33.0)
MCHC: 33.9 g/dL (ref 31.5–35.7)
MCV: 92 fL (ref 79–97)
Platelets: 209 10*3/uL (ref 150–450)
RBC: 4.7 x10E6/uL (ref 3.77–5.28)
RDW: 12.3 % (ref 11.7–15.4)
WBC: 9.1 10*3/uL (ref 3.4–10.8)

## 2023-04-29 LAB — COMPREHENSIVE METABOLIC PANEL WITH GFR
ALT: 10 IU/L (ref 0–32)
AST: 15 IU/L (ref 0–40)
Albumin: 4.8 g/dL (ref 3.9–4.9)
Alkaline Phosphatase: 70 IU/L (ref 44–121)
BUN/Creatinine Ratio: 16 (ref 9–23)
BUN: 14 mg/dL (ref 6–20)
Bilirubin Total: 0.5 mg/dL (ref 0.0–1.2)
CO2: 20 mmol/L (ref 20–29)
Calcium: 8.9 mg/dL (ref 8.7–10.2)
Chloride: 104 mmol/L (ref 96–106)
Creatinine, Ser: 0.87 mg/dL (ref 0.57–1.00)
Globulin, Total: 2.1 g/dL (ref 1.5–4.5)
Glucose: 82 mg/dL (ref 70–99)
Potassium: 3.6 mmol/L (ref 3.5–5.2)
Sodium: 139 mmol/L (ref 134–144)
Total Protein: 6.9 g/dL (ref 6.0–8.5)
eGFR: 91 mL/min/{1.73_m2} (ref >59)

## 2023-04-29 LAB — LIPID PANEL
Chol/HDL Ratio: 3.8 ratio (ref 0.0–4.4)
Cholesterol, Total: 186 mg/dL (ref 100–199)
HDL: 49 mg/dL (ref >39)
LDL Chol Calc (NIH): 118 mg/dL — ABNORMAL HIGH (ref 0–99)
Triglycerides: 102 mg/dL (ref 0–149)
VLDL Cholesterol Cal: 19 mg/dL (ref 5–40)

## 2023-04-29 LAB — HEMOGLOBIN A1C
Est. average glucose Bld gHb Est-mCnc: 97 mg/dL
Hgb A1c MFr Bld: 5 % (ref 4.8–5.6)

## 2023-04-29 LAB — TSH: TSH: 0.701 u[IU]/mL (ref 0.450–4.500)

## 2023-07-20 ENCOUNTER — Encounter

## 2023-07-20 NOTE — Patient Instructions (Signed)

## 2023-07-26 NOTE — Progress Notes (Signed)
 This encounter was created in error - please disregard.

## 2023-07-31 DIAGNOSIS — N3001 Acute cystitis with hematuria: Secondary | ICD-10-CM | POA: Diagnosis not present

## 2023-07-31 DIAGNOSIS — R309 Painful micturition, unspecified: Secondary | ICD-10-CM | POA: Diagnosis not present

## 2023-08-03 ENCOUNTER — Ambulatory Visit

## 2024-01-12 ENCOUNTER — Encounter: Payer: Self-pay | Admitting: Primary Care

## 2024-01-12 ENCOUNTER — Ambulatory Visit: Admitting: Primary Care

## 2024-01-12 VITALS — BP 114/76 | HR 74 | Temp 98.1°F | Ht 68.0 in | Wt 171.1 lb

## 2024-01-12 DIAGNOSIS — H6690 Otitis media, unspecified, unspecified ear: Secondary | ICD-10-CM | POA: Diagnosis not present

## 2024-01-12 MED ORDER — AMOXICILLIN-POT CLAVULANATE 875-125 MG PO TABS: 1.0000 | ORAL_TABLET | Freq: Two times a day (BID) | ORAL | 0 refills | Status: DC

## 2024-01-12 NOTE — Progress Notes (Signed)
 "  Subjective:    Patient ID: Monica Price, female    DOB: 23-Sep-1990, 33 y.o.   MRN: [id number]  Monica Price is a very pleasant 33 y.o. female who presents today to discuss otalgia.  Symptom onset 3 to 4 days ago with pain to the right ear. She was sick last week with nausea without vomiting, diarrhea, chills and body aches, low grade fever. Symptoms resolved completely resolved. Her entire family had these symptoms.   Her ear pain occurred when her GI symptoms resolved.    Review of Systems  Constitutional:  Negative for chills, fatigue and fever.  HENT:  Positive for ear pain.   Respiratory:  Negative for cough.   Neurological:  Negative for headaches.         Past Medical History:  Diagnosis Date   Anemia    GERD (gastroesophageal reflux disease)    Interstitial cystitis    Microscopic hematuria    Recurrent UTI    Pt has been treated for kidney infection and UTI for 10 months    Social History   Socioeconomic History   Marital status: Married    Spouse name: Not on file   Number of children: Not on file   Years of education: Not on file   Highest education level: Not on file  Occupational History   Not on file  Tobacco Use   Smoking status: Never   Smokeless tobacco: Never  Vaping Use   Vaping status: Never Used  Substance and Sexual Activity   Alcohol use: Not Currently    Comment: RARE   Drug use: Never   Sexual activity: Yes    Birth control/protection: None  Other Topics Concern   Not on file  Social History Narrative   ** Merged History Encounter **       Social Drivers of Health   Tobacco Use: Low Risk (01/12/2024)   Patient History    Smoking Tobacco Use: Never    Smokeless Tobacco Use: Never    Passive Exposure: Not on file  Financial Resource Strain: Not on file  Food Insecurity: Not on file  Transportation Needs: Not on file  Physical Activity: Not on file  Stress: Not on file  Social Connections: Not on file  Intimate  Partner Violence: Not on file  Depression (PHQ2-9): Low Risk (01/12/2024)   Depression (PHQ2-9)    PHQ-2 Score: 0  Alcohol Screen: Not on file  Housing: Not on file  Utilities: Not on file  Health Literacy: Not on file    Past Surgical History:  Procedure Laterality Date   COLONOSCOPY WITH PROPOFOL  N/A 01/21/2022   Procedure: COLONOSCOPY WITH PROPOFOL ;  Surgeon: Therisa Bi, MD;  Location: [facility] ENDOSCOPY;  Service: Gastroenterology;  Laterality: N/A;   CYSTO WITH HYDRODISTENSION N/A 02/08/2018   Procedure: CYSTOSCOPY/HYDRODISTENSION;  Surgeon: Kassie Ozell SAUNDERS, MD;  Location: [facility] ORS;  Service: Urology;  Laterality: N/A;   ESOPHAGOGASTRODUODENOSCOPY N/A 01/21/2022   Procedure: ESOPHAGOGASTRODUODENOSCOPY (EGD);  Surgeon: Therisa Bi, MD;  Location: [facility] ENDOSCOPY;  Service: Gastroenterology;  Laterality: N/A;   WISDOM TOOTH EXTRACTION      Family History  Problem Relation Age of Onset   Healthy Mother    Healthy Father    Bladder Cancer Maternal Aunt        great   Bladder Cancer Maternal Uncle        great   Kidney Stones Maternal Grandmother    Throat cancer Maternal Grandfather    Kidney disease Neg Hx  Prostate cancer Neg Hx    Kidney cancer Neg Hx    Ovarian cancer Neg Hx    Breast cancer Neg Hx     Allergies[1]  Medications Ordered Prior to Encounter[2]  BP 114/76   Pulse 74   Temp 98.1 F (36.7 C) (Oral)   Ht 5' 8 (1.727 m)   Wt 171 lb 2 oz (77.6 kg)   SpO2 98%   BMI 26.02 kg/m  Objective:   Physical Exam Constitutional:      Appearance: She is not ill-appearing.  HENT:     Right Ear: Ear canal normal. Tympanic membrane is injected and erythematous.     Left Ear: Tympanic membrane and ear canal normal.     Nose: No mucosal edema.     Right Sinus: No maxillary sinus tenderness or frontal sinus tenderness.     Left Sinus: No maxillary sinus tenderness or frontal sinus tenderness.     Mouth/Throat:     Mouth: Mucous membranes are moist.  Eyes:      Conjunctiva/sclera: Conjunctivae normal.  Cardiovascular:     Rate and Rhythm: Normal rate and regular rhythm.  Pulmonary:     Effort: Pulmonary effort is normal.     Breath sounds: Normal breath sounds. No wheezing or rhonchi.  Musculoskeletal:     Cervical back: Neck supple.  Skin:    General: Skin is warm and dry.     Physical Exam        Assessment & Plan:  Acute otitis media, unspecified otitis media type Assessment & Plan: Evident on exam today. Respiratory evaluation otherwise unremarkable.  Start Augmentin  antibiotics. Take 1 tablet by mouth twice daily for 7 days.  We discussed Tylenol /ibuprofen  for pain. Return precautions provided  Orders: -     Amoxicillin -Pot Clavulanate; Take 1 tablet by mouth 2 (two) times daily.  Dispense: 14 tablet; Refill: 0    Assessment and Plan Assessment & Plan         Comer MARLA Gaskins, NP       [1] No Known Allergies [2]  Current Outpatient Medications on File Prior to Visit  Medication Sig Dispense Refill   levonorgestrel  (MIRENA ) 20 MCG/DAY IUD 1 each by Intrauterine route once.     No current facility-administered medications on file prior to visit.   "

## 2024-01-12 NOTE — Assessment & Plan Note (Signed)
 Evident on exam today. Respiratory evaluation otherwise unremarkable.  Start Augmentin  antibiotics. Take 1 tablet by mouth twice daily for 7 days.  We discussed Tylenol /ibuprofen  for pain. Return precautions provided

## 2024-01-12 NOTE — Patient Instructions (Signed)
 Start Augmentin  antibiotics. Take 1 tablet by mouth twice daily for 7 days.  You may take Tylenol  or Ibuprofen  as needed for pain.  It was a pleasure to see you today!

## 2024-01-25 NOTE — Progress Notes (Unsigned)
 S: Presents today for IUD removal. Due for pap. Pt wants to defer pap w/ cotesting today. Due for annual in April and will have done then.  O: BP 108/84   Pulse 86   Ht 5' 8 (1.727 m)   Wt 172 lb 14.4 oz (78.4 kg)   LMP  (LMP Unknown)   BMI 26.29 kg/m   Cervix visualized with speculum. IUD strings visible at os. Strings grasped with ring forceps and intact IUD easily removed with gentle traction. There was minimal bleeding. Pt tolerated this well.  A/P: IUD removed intact. Pap: in April. RTC in April for annual GYN visit.  -Lauraine Lakes, CNM

## 2024-01-26 ENCOUNTER — Encounter: Payer: Self-pay | Admitting: Registered Nurse

## 2024-01-26 ENCOUNTER — Ambulatory Visit: Admitting: Registered Nurse

## 2024-01-26 VITALS — BP 108/84 | HR 86 | Ht 68.0 in | Wt 172.9 lb

## 2024-01-26 DIAGNOSIS — Z30432 Encounter for removal of intrauterine contraceptive device: Secondary | ICD-10-CM

## 2024-01-26 NOTE — Patient Instructions (Signed)
 IUD POST-PROCEDURE INSTRUCTIONS  You may take Ibuprofen , Aleve  or Tylenol  for pain if needed.  Cramping should resolve within in 24 hours.  You may have a small amount of spotting.  You should wear a mini pad for the next few days.  You may have intercourse after 24 hours.  If you using this for birth control, it is effective immediately.  You need to call if you have any pelvic pain, fever, heavy bleeding or foul smelling vaginal discharge.  Irregular bleeding is common the first several months after having an IUD placed. You do not need to call for this reason unless you are concerned.  Shower or bathe as normal  You should have a follow-up appointment in 4-8 weeks for a re-check to make sure you are not having any problems.
